# Patient Record
Sex: Female | Born: 1980
Health system: Southern US, Community
[De-identification: ages and names within clinical notes are randomized; demographics above are authoritative.]

## PROBLEM LIST (undated history)

## (undated) DIAGNOSIS — D649 Anemia, unspecified: Secondary | ICD-10-CM

## (undated) DIAGNOSIS — F32A Depression, unspecified: Secondary | ICD-10-CM

## (undated) DIAGNOSIS — F419 Anxiety disorder, unspecified: Secondary | ICD-10-CM

## (undated) HISTORY — DX: Depression, unspecified: F32.A

## (undated) HISTORY — DX: Anxiety disorder, unspecified: F41.9

---

## 2010-07-09 DIAGNOSIS — N059 Unspecified nephritic syndrome with unspecified morphologic changes: Secondary | ICD-10-CM

## 2010-07-09 HISTORY — DX: Unspecified nephritic syndrome with unspecified morphologic changes: N05.9

## 2011-07-10 DIAGNOSIS — O24419 Gestational diabetes mellitus in pregnancy, unspecified control: Secondary | ICD-10-CM

## 2011-07-10 HISTORY — DX: Gestational diabetes mellitus in pregnancy, unspecified control: O24.419

## 2019-08-26 ENCOUNTER — Ambulatory Visit (INDEPENDENT_AMBULATORY_CARE_PROVIDER_SITE_OTHER): Payer: BLUE CROSS/BLUE SHIELD | Admitting: Family Medicine

## 2019-09-28 ENCOUNTER — Encounter (INDEPENDENT_AMBULATORY_CARE_PROVIDER_SITE_OTHER): Payer: Self-pay | Admitting: Family Medicine

## 2019-09-28 ENCOUNTER — Ambulatory Visit (INDEPENDENT_AMBULATORY_CARE_PROVIDER_SITE_OTHER): Payer: BLUE CROSS/BLUE SHIELD | Admitting: Family Medicine

## 2019-09-28 VITALS — BP 118/80 | HR 80 | Temp 97.8°F | Resp 16 | Ht 67.75 in | Wt 137.0 lb

## 2019-09-28 DIAGNOSIS — E611 Iron deficiency: Secondary | ICD-10-CM | POA: Insufficient documentation

## 2019-09-28 DIAGNOSIS — E782 Mixed hyperlipidemia: Secondary | ICD-10-CM

## 2019-09-28 DIAGNOSIS — E559 Vitamin D deficiency, unspecified: Secondary | ICD-10-CM | POA: Insufficient documentation

## 2019-09-28 DIAGNOSIS — Z23 Encounter for immunization: Secondary | ICD-10-CM

## 2019-09-28 DIAGNOSIS — N078 Hereditary nephropathy, not elsewhere classified with other morphologic lesions: Secondary | ICD-10-CM

## 2019-09-28 DIAGNOSIS — Z01419 Encounter for gynecological examination (general) (routine) without abnormal findings: Secondary | ICD-10-CM

## 2019-09-28 NOTE — Patient Instructions (Addendum)
Sunday,    It was nice to meet you today! I am excited that you were able to get your Covid vaccine today.    1. Return to our walk-in clinic in 2 weeks to get your Tdap (tetanus shot).  2. You were given orders to have labs drawn. I will let you know when the results return.    Follow up in 6 months to check in on your mood and the zoloft. Otherwise, your next physical will be due in 1 year.    Take care,  Dr. Alwyn Pea

## 2019-09-28 NOTE — Progress Notes (Signed)
Date of Exam: 09/28/2019 4:52 PM        Patient ID: Gabrielle Dean is a 39 y.o. female.  Physician: Vanessa Ralphs, MD        Chief Complaint:    Chief Complaint   Patient presents with    Establish Care     New patient    Annual Exam             HPI:    Visit Type: Health Maintenance Visit  New patient, previously [facility] to [place] 2.5 years ago    Has hereditary nephritis  Mom and grandmother see a nephrologist in [place], has agreed to see her virtually  Has been on cardizem in past, not currently    Patient lives with husband, 2 kids 9yo, 7yo    CARDIAC SCREENING:  Diet: well-balanced, rare fast food, no soda; son recently dx with Crohn so has been eating more healthy recently  Exercise: walks dog daily  Sleep: no concerns  Tobacco/drug use: never  Alcohol: rare  Family hx of cardiac disease or stroke: yes    CANCER SCREENING:  ROS: Denies HA, fevers, unintentional weight loss  -PAP smear: 01/2018 normal, sees OBGYN    INFECTIOUS DISEASE:  -Tetanus shot: unsure, probably due  -Flu shot: 04/2019  -COVID vaccine: receiving today [facility] and [place]  -HIV one time screening: declines  -Hepatitis C Screening: declines    GENERAL HEALTH:  -Mood: on Zoloft 50mg  for depression, also a drop of 30mg  CBD oil at night  -Periods: regular  -Sexual activity/contraception/STD screening: Mirena IUD placed 01/2018            Problem List:    Patient Active Problem List   Diagnosis    Iron deficiency    Vitamin D deficiency    Mixed hyperlipidemia    Hereditary nephritis             Current Meds:    Outpatient Medications Marked as Taking for the 09/28/19 encounter (Office Visit) with Vanessa Ralphs, MD   Medication Sig Dispense Refill    b complex vitamins tablet Take 1 tablet by mouth daily      levonorgestrel (MIRENA) 20 MCG/24HR IUD 1 each by Intrauterine route once      Multiple Vitamins-Iron (Multivitamin Plus Iron Adult) Tab Take 1 tablet by mouth daily      sertraline  (ZOLOFT) 100 MG tablet Take 100 mg by mouth daily Take 1/2 tablet once daily      vitamin D (CHOLECALCIFEROL) 25 MCG (1000 UT) tablet Take 1,000 Units by mouth daily            Allergies:    Allergies   Allergen Reactions    Codeine Nausea And Vomiting           Past Surgical History:    Past Surgical History:   Procedure Laterality Date    uterer Right 1990    repair    VAGINAL DELIVERY  11/30/2009    1 M, 8lb 12 oz, 38weeks 6days.    VAGINAL DELIVERY  07/15/2012    77F, 8lb 14oz, 38weeks 5 days.           Family History:    Family History   Problem Relation Age of Onset    Arthritis Mother     Anxiety disorder Mother     Depression Mother     Hypertension Mother     Hyperlipidemia Mother     Nephritis Mother  Kidney cancer Father     Hypertension Father     Hyperlipidemia Father     Arthritis Maternal Grandmother     Hypertension Maternal Grandmother     Nephritis Maternal Grandmother     Arthritis Maternal Grandfather     Hypertension Maternal Grandfather     Diabetes type II Paternal Grandmother     Myocardial Infarction Paternal Grandmother         X2    Crohn's disease Son            Social History:    Social History     Tobacco Use    Smoking status: Never Smoker    Smokeless tobacco: Never Used   Substance Use Topics    Alcohol use: Not on file     Comment: Rarely    Drug use: Yes     Comment: CBD oil, 1 drop nightly          The following sections were reviewed this encounter by the provider: Meds     Problems   Med Hx   Surg Hx   Fam Hx              Vital Signs:    BP 118/80    Pulse 80    Temp 97.8 F (36.6 C)    Resp 16    Ht 1.721 m (5' 7.75")    Wt 62.1 kg (137 lb)    LMP 09/19/2019    BMI 20.98 kg/m          ROS:    Gen: Denies fevers, chills, unintentional weight loss  HEENT: Denies vision changes, hearing problems, congestion, sore throat  Cardiovascular: Denies CP, LE swelling, palpitations   Respiratory: Denies SOB, cough  Gastrointestinal: Denies abd pain, nausea,  vomiting, diarrhea, constipation, blood in the stool  Genitourinary: Denies dysuria or hematuria  Musculoskeletal: Denies joint pains or weakness  Skin: Denies rashes  Neurologic: Denies headaches or dizziness          Physical Exam:    Gen: well-appearing, awake and alert, no acute distress  Head: NC/AT  Ears: normal external ears, normal hearing, TMs and canals normal bilaterally  Eyes: PERRLA, EOMI, no conjunctival injection, no scleral icterus  Neck: supple, no cervical LAD, no thyromegaly  Heart: RRR, no murmurs, rubs, or gallops  Lungs: normal work of breathing, speaking in full sentences without difficulty, CTAB, no wheezing, rhonchi, or rales  Abd: soft, non-distended, non-tender to palpation, no organomegaly, BS+  Extremities: moves all extremities without difficulty, no LE swelling, warm  Skin: no rashes  Psych: normal mood and affect, clear speech           Assessment    1. Well woman exam  - Basic Metabolic Panel  - Lipid panel  - Hemoglobin A1C    2. Immunization due  - COVID-19 AD26 vaccine 0.5 ML (JANSSEN)    3. Mixed hyperlipidemia  - Basic Metabolic Panel  - Lipid panel    4. Hereditary nephritis  - Basic Metabolic Panel          Plan:    Patient Instructions   Gabrielle Dean,    It was nice to meet you today! I am excited that you were able to get your Covid vaccine today.    1. Return to our walk-in clinic in 2 weeks to get your Tdap (tetanus shot).  2. You were given orders to have labs drawn. I will let you know when the results return.  Follow up in 6 months to check in on your mood and the zoloft. Otherwise, your next physical will be due in 1 year.    Take care,  Dr. Alwyn Pea          Follow-up:    Return in about 1 year (around 09/27/2020) for annual physical.         Vanessa Ralphs, MD

## 2019-09-29 ENCOUNTER — Encounter (INDEPENDENT_AMBULATORY_CARE_PROVIDER_SITE_OTHER): Payer: Self-pay | Admitting: Family Medicine

## 2019-09-29 LAB — LIPID PANEL
Cholesterol / HDL Ratio: 6.8 ratio — ABNORMAL HIGH (ref 0.0–4.4)
Cholesterol: 287 mg/dL — ABNORMAL HIGH (ref 100–199)
HDL: 42 mg/dL (ref >39)
LDL Chol Calculated (NIH): 151 mg/dL — ABNORMAL HIGH (ref 0–99)
Triglycerides: 491 mg/dL — ABNORMAL HIGH (ref 0–149)
VLDL Calculated: 94 mg/dL — ABNORMAL HIGH (ref 5–40)

## 2019-09-29 LAB — BASIC METABOLIC PANEL
African American eGFR: 65 mL/min/{1.73_m2} (ref >59)
BUN / Creatinine Ratio: 22 (ref 9–23)
BUN: 27 mg/dL — ABNORMAL HIGH (ref 6–20)
CO2: 25 mmol/L (ref 20–29)
Calcium: 9.6 mg/dL (ref 8.7–10.2)
Chloride: 101 mmol/L (ref 96–106)
Creatinine: 1.24 mg/dL — ABNORMAL HIGH (ref 0.57–1.00)
Glucose: 90 mg/dL (ref 65–99)
Potassium: 5 mmol/L (ref 3.5–5.2)
Sodium: 140 mmol/L (ref 134–144)
non-African American eGFR: 56 mL/min/{1.73_m2} — ABNORMAL LOW (ref >59)

## 2019-09-29 LAB — HEMOGLOBIN A1C: Hemoglobin A1C: 5.3 % (ref 4.8–5.6)

## 2019-09-29 NOTE — Progress Notes (Signed)
Patient seen and discussed with Emma E Damon, MD   concurrently with the patient's visit/immediately following the patient's visit. Chart reviewed to include allergies, problems, current medications and family, social and past medical and surgical histories.      I reviewed the history, review of systems and physical exam as outlined in Emma E Damon, MD note and I agree with the findings as outlined in their note.      We discussed the assessment and plan for the encounter in detail and I concur with the assessment and plan as per the resident.

## 2019-10-08 ENCOUNTER — Encounter (INDEPENDENT_AMBULATORY_CARE_PROVIDER_SITE_OTHER): Payer: Self-pay | Admitting: Family Medicine

## 2020-02-05 ENCOUNTER — Other Ambulatory Visit: Payer: Self-pay

## 2020-02-05 ENCOUNTER — Emergency Department
Admission: EM | Admit: 2020-02-05 | Discharge: 2020-02-05 | Disposition: A | Discharge status: Home / Self Care | Payer: BLUE CROSS/BLUE SHIELD | Attending: Student in an Organized Health Care Education/Training Program | Admitting: Student in an Organized Health Care Education/Training Program

## 2020-02-05 ENCOUNTER — Emergency Department: Payer: BLUE CROSS/BLUE SHIELD

## 2020-02-05 DIAGNOSIS — D219 Benign neoplasm of connective and other soft tissue, unspecified: Secondary | ICD-10-CM

## 2020-02-05 DIAGNOSIS — N938 Other specified abnormal uterine and vaginal bleeding: Secondary | ICD-10-CM

## 2020-02-05 DIAGNOSIS — D251 Intramural leiomyoma of uterus: Secondary | ICD-10-CM | POA: Insufficient documentation

## 2020-02-05 LAB — URINALYSIS REFLEX TO MICROSCOPIC EXAM - REFLEX TO CULTURE
Bilirubin, UA: NEGATIVE
Glucose, UA: NEGATIVE
Ketones UA: NEGATIVE
Leukocyte Esterase, UA: NEGATIVE
Nitrite, UA: NEGATIVE
Protein, UR: >=500 — AB
Specific Gravity UA: 1.009 (ref 1.001–1.035)
Urine pH: 6 (ref 5.0–8.0)
Urobilinogen, UA: NORMAL mg/dL (ref 0.2–2.0)

## 2020-02-05 LAB — CBC AND DIFFERENTIAL
Absolute NRBC: 0 10*3/uL (ref 0.00–0.00)
Basophils Absolute Automated: 0.06 10*3/uL (ref 0.00–0.08)
Basophils Automated: 0.6 %
Eosinophils Absolute Automated: 0.22 10*3/uL (ref 0.00–0.44)
Eosinophils Automated: 2.2 %
Hematocrit: 36.9 % (ref 34.7–43.7)
Hgb: 12.4 g/dL (ref 11.4–14.8)
Immature Granulocytes Absolute: 0.04 10*3/uL (ref 0.00–0.07)
Immature Granulocytes: 0.4 %
Lymphocytes Absolute Automated: 2.34 10*3/uL (ref 0.42–3.22)
Lymphocytes Automated: 23.6 %
MCH: 30.5 pg (ref 25.1–33.5)
MCHC: 33.6 g/dL (ref 31.5–35.8)
MCV: 90.9 fL (ref 78.0–96.0)
MPV: 10.5 fL (ref 8.9–12.5)
Monocytes Absolute Automated: 0.72 10*3/uL (ref 0.21–0.85)
Monocytes: 7.3 %
Neutrophils Absolute: 6.52 10*3/uL — ABNORMAL HIGH (ref 1.10–6.33)
Neutrophils: 65.9 %
Nucleated RBC: 0 /100 WBC (ref 0.0–0.0)
Platelets: 267 10*3/uL (ref 142–346)
RBC: 4.06 10*6/uL (ref 3.90–5.10)
RDW: 12 % (ref 11–15)
WBC: 9.9 10*3/uL — ABNORMAL HIGH (ref 3.10–9.50)

## 2020-02-05 LAB — BASIC METABOLIC PANEL
Anion Gap: 8 (ref 5.0–15.0)
BUN: 24 mg/dL — ABNORMAL HIGH (ref 7–19)
CO2: 25 mEq/L (ref 22–29)
Calcium: 9.2 mg/dL (ref 8.5–10.5)
Chloride: 106 mEq/L (ref 100–111)
Creatinine: 1.4 mg/dL — ABNORMAL HIGH (ref 0.6–1.0)
Glucose: 103 mg/dL — ABNORMAL HIGH (ref 70–100)
Potassium: 4.4 mEq/L (ref 3.5–5.1)
Sodium: 139 mEq/L (ref 136–145)

## 2020-02-05 LAB — HCG, SERUM, QUALITATIVE: Hcg Qualitative: NEGATIVE

## 2020-02-05 LAB — GFR: EGFR: 42.4

## 2020-02-05 NOTE — ED Notes (Signed)
Pt provided with [place] cd copy

## 2020-02-05 NOTE — Discharge Instructions (Signed)
Dear Gabrielle Dean:    You were evaluated in the Emergency Department for vaginal bleeding.  Your labs show no acute abnormalities.  Your ultrasound shows you have fibroids.    The examination and treatment you have received today has been on an emergency basis only and has not been intended to substitute for complete medical care.  Since it is impossible to recognize and treat all elements of your injury or illness in a single visit, for your protection in preventing possible complications, if you are not clearly improving in 1-2 days, you should be seen for a re-check at the ER or with your primary care physician.        Instructions:    Continue home medications as prescribed.    Return to the ER for fevers, severe pain, intractable vomiting, chest pain, shortness of breath, worsening of conditions or any concerns.    Follow up with your ob/gyn in 1-2 days    Thank you for choosing [facility] for your medical care.    Sincerely,  Rosamaria Lints, MD  [facility] of Emergency Medicine    ________________________________________________________________    Thank you for choosing [facility] for your emergency care needs.  We strive to provide EXCELLENT care to you and your family.      IF YOU DO NOT CONTINUE TO IMPROVE OR YOUR CONDITION WORSENS, PLEASE CONTACT YOUR DOCTOR OR RETURN IMMEDIATELY TO THE EMERGENCY DEPARTMENT.    ONSITE PHARMACY  Our full service onsite pharmacy is a 2 minute walk from the ER.  Open Mon to Fri from 8 am to 8 pm, Sat  9 am to 5 pm. Ask an ED staff member for directions.  We accept all major insurances and prices are competitive with major retailers.  Ask your provider to print your prescriptions down to the pharmacy to speed you on your way home.    OBTAINING A PRIMARY CARE APPOINTMENT    Primary care physicians (PCPs, also known as primary care doctors) are either internists or family medicine doctors. Both types of PCPs focus on health promotion,  disease prevention, patient education and counseling, and treatment of acute and chronic medical conditions.    Call for an appointment with a primary care doctor.  Ask to see who is taking new patients. Two options are below.    Both groups have offices near [place] and in the [place] area.    [facility]  telephone:  [phone]  [email or web address]    [facility]  telephone:  [phone]  [email or web address]      DOCTOR REFERRALS  Call [phone] (available 24 hours a day, 7 days a week) if you need any further referrals and we can help you find a primary care doctor or specialist.  Also, available online at:  [email or web address]      YOUR CONTACT INFORMATION  Before leaving please check with registration to make sure we have an up-to-date contact number.  You can call registration at [phone] to update your information.  For questions about your hospital bill, please call [phone].  For questions about your Emergency Dept Physician bill please call [phone].      FREE HEALTH SERVICES  If you need help with health or social services, please call 2-1-1 for a free referral to resources in your area.  2-1-1 is a free service connecting people with information on health insurance, free clinics, pregnancy, mental health, dental care, food  assistance, housing, and substance abuse counseling.  Also, available online at:  http://www.[facility].org    MEDICAL RECORDS AND TESTS  Certain laboratory test results do not come back the same day, for example urine cultures.   We will contact you if other important findings are noted.  Radiology films are often reviewed again to ensure accuracy.  If there is any discrepancy, we will notify you.      Please call [phone] to pick up a complimentary CD of any radiology studies performed.  If you or your doctor would like to request a copy of your medical records, please call [phone].       ORTHOPEDIC INJURY   Please know that significant injuries can exist even when an initial x-ray is read as normal or negative.  This can occur because some fractures (broken bones) are not initially visible on x-rays.  For this reason, close outpatient follow-up with your primary care doctor or bone specialist (orthopedist) is required.    MEDICATIONS AND FOLLOWUP  Please be aware that some prescription medications can cause drowsiness.  Use caution when driving or operating machinery.    The examination and treatment you have received in our Emergency Department is provided on an emergency basis, and is not intended to be a substitute for your primary care physician.  It is important that your doctor checks you again and that you report any new or remaining problems at that time.      24 HOUR PHARMACIES  [facility] [address] [place], [place] [address] (1.4 miles, 7 minutes)  [facility] - [address], [place], [place] [address] (6.5 miles, 13 minutes)  Handout with directions available on request        ASSISTANCE WITH INSURANCE    Affordable Care Act  [facility])  Call to start or finish an application, compare plans, enroll or ask a question.  [phone]  TTY: [phone]  Web:  Healthcare.gov    Help Enrolling in [facility]  [employer] [place]  [phone] (TOLL-FREE)  [phone] (TTY)  Web:  Http://www.coverva.org    Local Help Enrolling in the [facility]  [place] [facility]  [phone] (MAIN)  Email:  health-help@nvfs .org  Web:  BlackjackMyths.is  Address:  [address] [place], [place] [address]        Dysfunctional Uterine Bleeding    You have been diagnosed with Dysfunctional Uterine Bleeding or D.U.B.    This means you have abnormal vaginal bleeding and are NOT pregnant.    You may have other symptoms. These could be pelvic/abdominal (belly) pain or cramps. You could also have back pain, nausea (feeling sick to your stomach) or problems urinating (peeing). You may  also have fatigue (feel tired) or have emotional disturbances. You may get these symptoms during your usual menstrual period. You may also get them at times in between. You may bleed the same amount as with a normal menstrual period. However, bleeding is usually heavier than normal.    Dysfunctional uterine bleeding is common. Many women get it and get evaluated.    Some causes are:   A change in hormone levels. This is the most common cause. It usually gets better without treatment. Sometimes recent changes in birth control pill use cause a temporary hormone imbalance. This will also get better without treatment.   Cervix or uterus infections or injury, endometriosis, uterine fibroids or tumors.   Sometimes the cause is more serious. However, this is rare. More serious causes include  cancer of the cervix or uterus.    The doctor caring for you decided it is OK for you to go home.    It is important to have a follow-up. See your gynecologist or family doctor in the next few days. If the medical staff did not tell your doctor about this bleeding evaluation visit, be sure to do so.   If you can't get the right follow-up care, tell the medical staff before you go. They can help make arrangements for you.    YOU SHOULD SEEK MEDICAL ATTENTION IMMEDIATELY, EITHER HERE OR AT THE NEAREST EMERGENCY DEPARTMENT, IF ANY OF THE FOLLOWING OCCURS:   Bad pain in the abdomen (belly), pelvis or back that gets worse.   Large amounts of vaginal bleeding that gets worse, soaking of pads/tampons (more than one pad per hour), passage of large clots.   Fever (temperature higher than 100.33F / 38C), chills, nausea, vomiting.   Dizziness, lightheadedness or fainting.

## 2020-02-05 NOTE — ED Notes (Signed)
DSW or Walking Ability Test    DSW Is the patient able to perform the following function independently without weakness or dizziness?   D (dangle): Ask patient to sit on the edge of the bed Yes   S (stand): Ask patient to stand up and observe for any fatigue or swaying Yes   W (walk): Ask patient to lift their feet completely off the floor 3-4 times Yes

## 2020-02-05 NOTE — ED Provider Notes (Signed)
[facility]    EMERGENCY DEPARTMENT HISTORY AND PHYSICAL EXAM    Patient Name: Gabrielle Dean, Gabrielle Dean  Encounter Date:  02/05/2020  Patient DOB:  Apr 08, 1981  MRN:  [id number]  Room:  21/B21    History of Presenting Illness     39 y.o. female ho anxiety, depression, nephritis co persistent, progressively worsening vaginal bleeding and lower abdominal cramping x 3 days.  Pt reports having vaginal spotting x 9-10 days and then increase in bleeding.  Pt states soaking through a heavy tampon and pad in 2 hours this evening.  Pt has been passing large clots.  Pt denies chest pain, SOB, nausea, vomiting, fevers, dizziness.  Denies taking blood thinners.  Pt has had an IUD in place for 1 year and 9 month.    PMD:  Dr. Alwyn Pea    Past Medical History     Past Medical History:   Diagnosis Date    Anxiety     Depression     Gestational diabetes 2013    with second pregnancy    Nephritis 2012    heriditary        Past Surgical History     Past Surgical History:   Procedure Laterality Date    uterer Right 1990    repair    VAGINAL DELIVERY  11/30/2009    1 M, 8lb 12 oz, 38weeks 6days.    VAGINAL DELIVERY  07/15/2012    11F, 8lb 14oz, 38weeks 5 days.       Family History     Family History   Problem Relation Age of Onset    Arthritis Mother     Anxiety disorder Mother     Depression Mother     Hypertension Mother     Hyperlipidemia Mother     Nephritis Mother     Kidney cancer Father     Hypertension Father     Hyperlipidemia Father     Arthritis Maternal Grandmother     Hypertension Maternal Grandmother     Nephritis Maternal Grandmother     Arthritis Maternal Grandfather     Hypertension Maternal Grandfather     Diabetes type II Paternal Grandmother     Myocardial Infarction Paternal Grandmother         X2    Crohn's disease Son        Social History     Social History     Occupational History    Not on file   Tobacco Use    Smoking status: Never Smoker    Smokeless tobacco: Never Used    Haematologist Use: Never used   Substance and Sexual Activity    Alcohol use: Not on file     Comment: Rarely    Drug use: Yes     Comment: CBD oil, 1 drop nightly    Sexual activity: Yes     Partners: Male        Review of Systems     Review of Systems   Constitutional: Negative for fever and chills.   HENT: Negative for sore throat.    Respiratory: Negative for cough and shortness of breath.    Cardiovascular: Negative for chest pain.   Gastrointestinal: Negative for nausea, vomiting, abdominal pain and diarrhea.   Genitourinary: Negative for dysuria.   Neurological: Negative for headaches.  Skin: Negative for rash   All other systems reviewed and are negative.    Physical Exam   BP 122/84  Pulse 80    Temp 98.2 F (36.8 C) (Oral)    Resp 15    SpO2 96%     Physical Exam   Constitutional: Patient is oriented to person, place, and time. Patient appears well-developed and well-nourished. No distress.   HENT:   Head: Normocephalic and atraumatic.   Right Ear: External ear normal.   Left Ear: External ear normal.   Nose: Nose normal.   Mouth/Throat: Oropharynx is clear and moist.   Eyes: Conjunctivae and EOM are normal.   Neck: Neck supple. No tracheal deviation present.   Cardiovascular: Normal rate, regular rhythm and normal heart sounds.  Exam reveals no gallop and no friction rub.  No murmur heard.  Pulmonary/Chest: No respiratory distress. Patient has no wheezes. Patient has no rales.   Abdominal: Soft. Patient exhibits no distension. There is no tenderness. There is no rebound and no guarding.   Pelvic: deferred  Musculoskeletal: Normal range of motion. Patient exhibits no edema.   Neurological: Patient is alert and oriented to person, place, and time. No gross cranial nerve deficit.   Skin: Skin is warm. No rash noted.   Psychiatric: Patient has a normal mood and affect. Patient's behavior is normal.   Nursing note and vitals reviewed.    ED Medications Administered     ED Medication Orders  (From admission, onward)    None          Orders Placed During This Encounter     Orders Placed This Encounter   Procedures    Urine culture    US Pelvic with Transvaginal (r/o torsion)    CBC and differential    Basic Metabolic Panel    GFR    Beta HCG, Qual, Serum    UA Reflex to Micro - Reflex to Culture       Diagnostic Study Results     The results of the diagnostic studies below were reviewed by the ED provider:    Labs  Results     Procedure Component Value Units Date/Time    UA Reflex to Micro - Reflex to Culture [[id number]]  (Abnormal) Collected: 02/05/20 0259     Updated: 02/05/20 0321     Urine Type Urine, Clean Ca     Color, UA Red     Clarity, UA Hazy     Specific Gravity UA 1.009     Urine pH 6.0     Leukocyte Esterase, UA Negative     Nitrite, UA Negative     Protein, UR >=500     Glucose, UA Negative     Ketones UA Negative     Urobilinogen, UA Normal mg/dL      Bilirubin, UA Negative     Blood, UA Large     RBC, UA TNTC /hpf      WBC, UA TNTC /hpf     Basic Metabolic Panel [[id number]]  (Abnormal) Collected: 02/05/20 0224    Specimen: Blood Updated: 02/05/20 0248     Glucose 103 mg/dL      BUN 24 mg/dL      Creatinine 1.4 mg/dL      Calcium 9.2 mg/dL      Sodium 657 mEq/L      Potassium 4.4 mEq/L      Chloride 106 mEq/L      CO2 25 mEq/L      Anion Gap 8.0    GFR [[id number]] Collected: 02/05/20 0224     Updated: 02/05/20 8413  EGFR 42.4    Beta HCG, Qual, Serum [[id number]] Collected: 02/05/20 0224    Specimen: Blood Updated: 02/05/20 0240     Hcg Qualitative Negative    CBC and differential [[id number]]  (Abnormal) Collected: 02/05/20 0224    Specimen: Blood Updated: 02/05/20 0232     WBC 9.90 x10 3/uL      Hgb 12.4 g/dL      Hematocrit 78.2 %      Platelets 267 x10 3/uL      RBC 4.06 x10 6/uL      MCV 90.9 fL      MCH 30.5 pg      MCHC 33.6 g/dL      RDW 12 %      MPV 10.5 fL      Neutrophils 65.9 %      Lymphocytes Automated 23.6 %      Monocytes 7.3 %      Eosinophils Automated 2.2 %       Basophils Automated 0.6 %      Immature Granulocytes 0.4 %      Nucleated RBC 0.0 /100 WBC      Neutrophils Absolute 6.52 x10 3/uL      Lymphocytes Absolute Automated 2.34 x10 3/uL      Monocytes Absolute Automated 0.72 x10 3/uL      Eosinophils Absolute Automated 0.22 x10 3/uL      Basophils Absolute Automated 0.06 x10 3/uL      Immature Granulocytes Absolute 0.04 x10 3/uL      Absolute NRBC 0.00 x10 3/uL           Radiologic Studies  Radiology Results (24 Hour)     Procedure Component Value Units Date/Time    US Pelvic with Transvaginal (r/o torsion) [[id number]] Collected: 02/05/20 0553    Order Status: Completed Updated: 02/05/20 0558    Narrative:      HISTORY: Evaluate vaginal bleeding    COMPARISON: None    TECHNIQUE: Transabdominal ultrasound of the pelvis. Transvaginal imaging  is performed to better characterize the uterus, ovaries and adnexa    FINDINGS:     UTERUS:  The uterus is anteverted, measuring 10.9 x 5.3 x 5.6 cm. There  is an intramural fibroid in the uterine fundus measuring 2.6 x 2.6 x 2.5  cm. An additional intraneural fibroid is in the posterior uterine body  measuring 1.5 x 1.5 x 1.5 cm. The endometrial stripe measures 6 mm and  is distended by a trace amount of anechoic fluid.    RIGHT OVARY: The right ovary measures 3.1 x 1.9 x 2.5 cm, and is  sonographically unremarkable.    LEFT OVARY:  The left ovary measures 2.8 x 2.1 x 2.1 cm, and is  sonographically unremarkable.    ADNEXA: No adnexal masses.    FREE FLUID: Small amount of simple appearing free fluid in the pelvis.      Impression:          No acute abnormality.    Fibroid uterus as described.    No evidence of ovarian torsion.    Theador Hawthorne   02/05/2020 5:56 AM            Monitors, EKG, Critical Care, Vitals and Splints   Cardiac Monitor (interpreted by ED physician):    EKG (interpreted by ED physician):   Critical Care:   Splint check:      MDM and Clinical Notes     MDM:  Eval for fibroid vs miscarriage vs ectopic  vs DUB.  Eval for  electrolyte abnl/anemia. Labs, [place]    5:00AM  Pt sitting comfortably in bed.    6:25AM  Extensive discussion with pt regarding results. Discussed pelvic exam, pt declines pelvic exam and will follow up with ob/gyn.  Offered to call ob/gyn.  Pt will call this morning.  Comfortable with d/c to home.  Return precautions discussed.  Will follow up with ob/gyn.      Diagnosis and Disposition     Clinical Impression  1. Dysfunctional uterine bleeding    2. Fibroid          Disposition  ED Disposition     ED Disposition Condition Date/Time Comment    Discharge  Fri Feb 05, 2020  6:25 AM Erin Sons discharge to home/self care.    Condition at disposition: Stable            Prescriptions       Discharge Prescriptions     None                 Rosamaria Lints, MD  02/05/20 [phone]

## 2020-02-13 ENCOUNTER — Other Ambulatory Visit: Payer: Self-pay | Admitting: Obstetrics & Gynecology

## 2020-05-09 ENCOUNTER — Ambulatory Visit (INDEPENDENT_AMBULATORY_CARE_PROVIDER_SITE_OTHER): Payer: BLUE CROSS/BLUE SHIELD | Admitting: Physician Assistant

## 2020-05-09 ENCOUNTER — Encounter (INDEPENDENT_AMBULATORY_CARE_PROVIDER_SITE_OTHER): Payer: Self-pay | Admitting: Physician Assistant

## 2020-05-09 VITALS — BP 122/72 | HR 76 | Temp 98.8°F | Resp 16 | Ht 68.0 in | Wt 135.0 lb

## 2020-05-09 DIAGNOSIS — G8929 Other chronic pain: Secondary | ICD-10-CM

## 2020-05-09 DIAGNOSIS — N078 Hereditary nephropathy, not elsewhere classified with other morphologic lesions: Secondary | ICD-10-CM

## 2020-05-09 DIAGNOSIS — F411 Generalized anxiety disorder: Secondary | ICD-10-CM

## 2020-05-09 DIAGNOSIS — R6884 Jaw pain: Secondary | ICD-10-CM

## 2020-05-09 DIAGNOSIS — Z01818 Encounter for other preprocedural examination: Secondary | ICD-10-CM

## 2020-05-09 MED ORDER — CYCLOBENZAPRINE HCL 10 MG PO TABS
5.0000 mg | ORAL_TABLET | Freq: Three times a day (TID) | ORAL | 0 refills | Status: AC | PRN
Start: 2020-05-09 — End: 2020-06-08

## 2020-05-09 NOTE — Progress Notes (Signed)
Subjective:      Patient ID: Gabrielle Dean is a 39 y.o. female     Chief Complaint   Patient presents with    Possible TMJ     x friday afternoon      HPI   Pt here for left-sided jaw pain.  Has had popping in jaw and intermittent dull pain for 3-4 months; saw dentist within past two weeks, told no signs of grinding.   At the end of last week, started with moderately severe left jaw pain that is constant.  Pt with long-standing anxiety; notes she had decreased sertraline from 100 to 50 mg once daily due to decreased libido; had added CBD to help anxiety, which did work.  Then had to stop CBD per gyn for upcoming surgery, stopped 3 days before jaw pain started.  Wonders if pain related to tension, stress on lower dose of medication.  Has taken Tylenol, then later in the evening Advil 400 mg, helped slightly.  Has hereditary nephritis; nephro in [place], last visit in 6-01/2020; avoids/limits nsaids.  Gyn is Dr. Maryclare Dean; gyn prescribes sertraline.  Pt for hysterectomy with gyn in few weeks, will need pre-op clearance and labs.    The following sections were reviewed this encounter by the provider:   Tobacco   Allergies   Meds   Problems   Med Hx   Surg Hx   Fam Hx        Patient Active Problem List   Diagnosis    Iron deficiency    Vitamin D deficiency    Mixed hyperlipidemia    Hereditary nephritis    GAD (generalized anxiety disorder)     Outpatient Medications Marked as Taking for the 05/09/20 encounter (Office Visit) with Gabrielle Dean, Gabrielle Fast, PA   Medication Sig Dispense Refill    b complex vitamins tablet Take 1 tablet by mouth daily      iron-vitamin C (Vitron-C) 65-125 MG Tab Take 1 tablet by oral route daily      Multiple Vitamins-Iron (Multivitamin Plus Iron Adult) Tab Take 1 tablet by mouth daily      sertraline (ZOLOFT) 100 MG tablet Take 100 mg by mouth daily Take 1/2 tablet once daily        Sprintec 28 0.25-35 MG-MCG per tablet       vitamin D (CHOLECALCIFEROL) 25 MCG (1000 UT) tablet Take 1,000  Units by mouth daily       Immunization History   Administered Date(s) Administered    COVID-19 Ad26 Vaccine Preservative Free 0.5 mL (JANSSEN) 09/28/2019    Influenza (Im) Preservative Free 04/09/2019    Influenza quadrivalent (IM) PF 3 Yrs & greater 05/02/2020     Review of Systems   Constitutional: Negative for chills and fever.   HENT: Negative for ear pain, sinus pain and sore throat.    Gastrointestinal: Positive for vomiting (x 1 on 05/06/2020).   Genitourinary: Positive for vaginal bleeding (menses started 1 day ago).   Musculoskeletal: Positive for arthralgias.   Skin: Negative for rash and wound.        BP 122/72    Pulse 76    Temp 98.8 F (37.1 C)    Resp 16    Ht 1.727 m (5\' 8" )    Wt 61.2 kg (135 lb)    LMP 05/08/2020    BMI 20.53 kg/m     Objective:     Physical Exam  Constitutional:       General: She is  not in acute distress.     Appearance: Normal appearance.   HENT:      Head: Normocephalic and atraumatic.      Jaw: Tenderness (on left) and pain on movement (on left) present. No swelling.      Right Ear: Tympanic membrane, ear canal and external ear normal. There is no impacted cerumen.      Left Ear: Tympanic membrane, ear canal and external ear normal. There is no impacted cerumen.      Mouth/Throat:      Mouth: Mucous membranes are moist.      Pharynx: No oropharyngeal exudate or posterior oropharyngeal erythema.   Cardiovascular:      Rate and Rhythm: Normal rate and regular rhythm.      Heart sounds: Normal heart sounds.   Pulmonary:      Effort: Pulmonary effort is normal.      Breath sounds: Normal breath sounds.   Skin:     General: Skin is warm.      Findings: No rash.   Neurological:      General: No focal deficit present.      Mental Status: She is alert.      Cranial Nerves: No cranial nerve deficit.   Psychiatric:         Mood and Affect: Mood normal.          Assessment/Plan:     1. Chronic jaw pain  - Ambulatory referral to Physical Therapy  - cyclobenzaprine (FLEXERIL) 10 MG  tablet; Take 0.5 tablets (5 mg total) by mouth every 8 (eight) hours as needed for Muscle spasms  Dispense: 20 tablet; Refill: 0  - Refer for PT  - Warm compress to area; tylenol as needed; sparingly use nsaid  - Cyclobenzaprine as needed  - RTC 05/12/2020 for f/u & preop clearance    2. Hereditary nephritis  - Use nsaid sparingly  - Has nephro in [place], last labs few months ago    3. GAD (generalized anxiety disorder)  - Temporarily increase sertraline back to 100 mg; consider switch to Cymbalta (duloxetine) after surgery    4. Preoperative clearance  - Basic Metabolic Panel  - CBC and differential         Gabrielle Dean Gabrielle Nanna, PA-C

## 2020-05-10 LAB — CBC AND DIFFERENTIAL
Baso(Absolute): 0.1 10*3/uL (ref 0.0–0.2)
Basos: 0 %
Eos: 1 %
Eosinophils Absolute: 0.1 10*3/uL (ref 0.0–0.4)
Hematocrit: 39.9 % (ref 34.0–46.6)
Hemoglobin: 12.6 g/dL (ref 11.1–15.9)
Immature Granulocytes Absolute: 0 10*3/uL (ref 0.0–0.1)
Immature Granulocytes: 0 %
Lymphocytes Absolute: 1.6 10*3/uL (ref 0.7–3.1)
Lymphocytes: 14 %
MCH: 29.2 pg (ref 26.6–33.0)
MCHC: 31.6 g/dL (ref 31.5–35.7)
MCV: 93 fL (ref 79–97)
Monocytes Absolute: 0.5 10*3/uL (ref 0.1–0.9)
Monocytes: 5 %
Neutrophils Absolute: 9.2 10*3/uL — ABNORMAL HIGH (ref 1.4–7.0)
Neutrophils: 80 %
Platelets: 366 10*3/uL (ref 150–450)
RBC: 4.31 x10E6/uL (ref 3.77–5.28)
RDW: 11.7 % (ref 11.7–15.4)
WBC: 11.5 10*3/uL — ABNORMAL HIGH (ref 3.4–10.8)

## 2020-05-10 LAB — BASIC METABOLIC PANEL
African American eGFR: 48 mL/min/{1.73_m2} — ABNORMAL LOW (ref >59)
BUN / Creatinine Ratio: 13 (ref 9–23)
BUN: 21 mg/dL — ABNORMAL HIGH (ref 6–20)
CO2: 25 mmol/L (ref 20–29)
Calcium: 9.6 mg/dL (ref 8.7–10.2)
Chloride: 103 mmol/L (ref 96–106)
Creatinine: 1.57 mg/dL — ABNORMAL HIGH (ref 0.57–1.00)
Glucose: 85 mg/dL (ref 65–99)
Potassium: 5.4 mmol/L — ABNORMAL HIGH (ref 3.5–5.2)
Sodium: 141 mmol/L (ref 134–144)
non-African American eGFR: 42 mL/min/{1.73_m2} — ABNORMAL LOW (ref >59)

## 2020-05-12 ENCOUNTER — Encounter (INDEPENDENT_AMBULATORY_CARE_PROVIDER_SITE_OTHER): Payer: Self-pay | Admitting: Physician Assistant

## 2020-05-12 ENCOUNTER — Ambulatory Visit (INDEPENDENT_AMBULATORY_CARE_PROVIDER_SITE_OTHER): Payer: BLUE CROSS/BLUE SHIELD | Admitting: Physician Assistant

## 2020-05-12 VITALS — BP 122/80 | HR 72 | Temp 98.6°F | Resp 16 | Ht 68.0 in | Wt 138.0 lb

## 2020-05-12 DIAGNOSIS — D72828 Other elevated white blood cell count: Secondary | ICD-10-CM

## 2020-05-12 DIAGNOSIS — G8929 Other chronic pain: Secondary | ICD-10-CM

## 2020-05-12 DIAGNOSIS — R6884 Jaw pain: Secondary | ICD-10-CM

## 2020-05-12 DIAGNOSIS — Z01818 Encounter for other preprocedural examination: Secondary | ICD-10-CM

## 2020-05-12 DIAGNOSIS — N078 Hereditary nephropathy, not elsewhere classified with other morphologic lesions: Secondary | ICD-10-CM

## 2020-05-12 DIAGNOSIS — N939 Abnormal uterine and vaginal bleeding, unspecified: Secondary | ICD-10-CM

## 2020-05-12 NOTE — Progress Notes (Signed)
Subjective:      Date: 05/12/2020 6:27 PM   Patient ID: Gabrielle Dean is a 39 y.o. female.    Chief Complaint:  Chief Complaint   Patient presents with    Pre-op Exam    Follow-up     jaw pain, pre-op labs     HPI:  Visit Type: Pre-operative evaluation, as requested by surgeon  Diagnosis: abnormal uterine bleeding  Procedure: total laparoscopic hysterectomy, bilateral salpingectomy  Date of Surgery: 05/30/2020  Surgeon: Maryclare Labrador, MD  Fax Number (Required): [phone]  Recent Health (admits): jaw pain, as below; resolving  Recent Health (denies): no current complaints  Exercise Tolerance: no problems with exercise tolerance  Surgical Risk Factors: renal disease, pre-op labs as below  Prior Anesthesia: Patient reports no adverse reaction to anesthesia in the past.      Pt is s/p her pre-op BMP & CBC on 05/09/2020; BMP with Cr 1.57, eGFR 42, K+ 5.4 (pt with h/o hereditary nephritis, Cr 1.4 01/2020, 1.24, eGFR 56 09/2019).  CBC shows wbc to 11.5 (9.9 01/2020).    Additional reason for visit:  05/09/2020:  Pt here for left-sided jaw pain.  Has had popping in jaw and intermittent dull pain for 3-4 months; saw dentist within past two weeks, told no signs of grinding.   At the end of last week, started with moderately severe left jaw pain that is constant.  Pt with long-standing anxiety; notes she had decreased sertraline from 100 to 50 mg once daily due to decreased libido; had added CBD to help anxiety, which did work.  Then had to stop CBD per gyn for upcoming surgery, stopped 3 days before jaw pain started.  Wonders if pain related to tension, stress on lower dose of medication.  Has taken Tylenol, then later in the evening Advil 400 mg, helped slightly.  Has hereditary nephritis; nephro in [place], last visit in 6-01/2020; avoids/limits nsaids.  Gyn is Dr. Maryclare Labrador; gyn prescribes sertraline.  Pt for hysterectomy with gyn in few weeks, will need pre-op clearance and labs.    05/12/2020:  Pt here for f/u.  Feeling  much better from last visit.  Still has tension in her jaw, but much more manageable than previous.  Has taken few doses of ibuprofen, and cyclobenzaprine, and heating pad.  Yesterday, took nothing until cyclobenzaprine in the evening.  Now back on sertraline 100 mg, which seems to be helping.    Problem List:  Patient Active Problem List   Diagnosis    Iron deficiency    Vitamin D deficiency    Mixed hyperlipidemia    Hereditary nephritis    GAD (generalized anxiety disorder)       Current Medications:  Outpatient Medications Marked as Taking for the 05/12/20 encounter (Surgical Consult) with Rameen Gohlke, Windy Fast, PA   Medication Sig Dispense Refill    b complex vitamins tablet Take 1 tablet by mouth daily      cyclobenzaprine (FLEXERIL) 10 MG tablet Take 0.5 tablets (5 mg total) by mouth every 8 (eight) hours as needed for Muscle spasms 20 tablet 0    iron-vitamin C (Vitron-C) 65-125 MG Tab Take 1 tablet by mouth every evening         Multiple Vitamins-Iron (Multivitamin Plus Iron Adult) Tab Take 1 tablet by mouth daily      sertraline (ZOLOFT) 100 MG tablet Take 100 mg by mouth every evening         Sprintec 28 0.25-35 MG-MCG per tablet Take  1 tablet by mouth every evening         vitamin D (CHOLECALCIFEROL) 25 MCG (1000 UT) tablet Take 1,000 Units by mouth daily         Allergies:  Allergies   Allergen Reactions    Codeine Nausea And Vomiting       Past Medical History:  Past Medical History:   Diagnosis Date    Anemia     On Vitron C    Anxiety     Depression     Gestational diabetes 2013    with second pregnancy - resolved    Nephritis 2012    heriditary - monitored/manged by Nephrology in [facility] 6 month follow up       Past Surgical History:  Past Surgical History:   Procedure Laterality Date    uterer Right 1990    repair    VAGINAL DELIVERY  11/30/2009    1 M, 8lb 12 oz, 38weeks 6days.    VAGINAL DELIVERY  07/15/2012    49F, 8lb 14oz, 38weeks 5 days.       Family History:  Family History    Problem Relation Age of Onset    Arthritis Mother     Anxiety disorder Mother     Depression Mother     Hypertension Mother     Hyperlipidemia Mother     Nephritis Mother     Kidney cancer Father     Hypertension Father     Hyperlipidemia Father     Arthritis Maternal Grandmother     Hypertension Maternal Grandmother     Nephritis Maternal Grandmother     Arthritis Maternal Grandfather     Hypertension Maternal Grandfather     Diabetes type II Paternal Grandmother     Myocardial Infarction Paternal Grandmother         X2    Crohn's disease Son        Social History:  Social History     Tobacco Use    Smoking status: Never Smoker    Smokeless tobacco: Never Used   Haematologist Use: Never used   Substance Use Topics    Alcohol use: Not on file     Comment: Rarely    Drug use: Not Currently     Comment: CBD oil, 1 drop nightly - stopped 05/02/20 per surgeon per pt         The following sections were reviewed this encounter by the provider:   Tobacco   Allergies   Meds   Problems   Med Hx   Surg Hx   Fam Hx          Vitals:  BP 122/80    Pulse 72    Temp 98.6 F (37 C)    Resp 16    Ht 1.727 m (5\' 8" )    Wt 62.6 kg (138 lb)    LMP 05/08/2020    BMI 20.98 kg/m     ROS:  Review of Systems   General/Constitutional:           Present--feeling well.  Denies fever and denies chills.      Respiratory:           Denies cough, difficulty breathing, difficulty breathing on exertion, and wheezing.   Cardiovascular:           Denies chest pain, difficulty breathing lying down, difficulty breathing on exertion, fainting/blacking out, leg pain, leg swelling, and palpitations.  Gastrointestinal:           Denies abdominal pain, black tarry stool, and bloody stool.       Hematology:           Denies abnormal bleeding, easy bleeding, and easy bruising.       Peripheral Vascular:           Denies swelling of extremities and leg pain with exertion.       Skin:           Denies rash and new lesions.        Neurologic:           Denies dizziness and syncope.         Objective:     Examination:   Physical Exam   General Examination:  GENERAL APPEARANCE: alert, in no acute distress, well developed, well nourished, oriented to time, place, and person.   HEAD: normocephalic, atraumatic with no lesions or palpable masses.  ENMT: Normal oral mucosa, normal oropharynx.   NECK/THYROID: neck supple, no lymphadenopathy, no thyromegaly.   SKIN: normal mobility and turgor.  No rashes.   HEART: S1, S2 normal, no murmurs, rubs, gallops, regular rate and rhythm.   LUNGS: normal effort / no distress, normal breath sounds, clear to auscultation bilaterally, no wheezes, rales, or rhonchi.   ABDOMEN: inspection normal.  Abdomen soft, non-tender, no rigidity/guarding, no palpable abdominal masses.  Bowel sounds normal.  EXTREMITIES: no clubbing, cyanosis, or edema.   PERIPHERAL PULSES: 2+ dorsalis pedis bilaterally.   PSYCH: alert, oriented, cognitive function intact.      Assessment/Plan:     1. Abnormal uterine bleeding  - For total laparoscopic hysterectomy, bilateral salpingectomy on 05/30/2020 with Maryclare Labrador, MD  - Appears well, no problems noted.  Patient has low clinical risk factors and good exercise tolerance, and is to undergo an intermediate risk procedure.  Labs checked per surgeon's request--will repeat in one week, as below.  If normal, patient may proceed to the OR without further evaluation.    2. Preoperative clearance    3. Chronic jaw pain  - Essentially resolved; monitor  - Follow up 06/2020 to discuss switch from sertraline to duloxetine    4. Hereditary nephritis  - Basic Metabolic Panel    5. Other elevated white blood cell (WBC) count  - CBC and differential  BMP with Cr 1.57, eGFR 42, K+ 5.4 (pt with h/o hereditary nephritis, Cr 1.4 01/2020, 1.24, eGFR 56 09/2019).  CBC shows wbc to 11.5 (9.9 01/2020).    Will repeat CBC & BMP next week for recheck; encourage regular fluids, and no nsaids until then.           Windy Fast Kallie Depolo, PA-C

## 2020-05-18 ENCOUNTER — Telehealth: Payer: BLUE CROSS/BLUE SHIELD

## 2020-05-18 ENCOUNTER — Encounter (HOSPITAL_BASED_OUTPATIENT_CLINIC_OR_DEPARTMENT_OTHER): Payer: Self-pay | Admitting: Obstetrics & Gynecology

## 2020-05-18 NOTE — Pre-Procedure Instructions (Signed)
·   Chart navigated by nav   1401 Phone Interview completed with pt   ANESTHESIA GUIDELINES: none   SURGEON REQUIREMENT:     Noted from pt completed 05/12/2020 Preop eval at PCP per surgeons order in epic.   SPECIALIST NOTES/TEST RESULT REQUESTS:        See Nav 1 hand off   FUTURE PLAN/UPCOMING APPTS:     Pt scheduled on 05/20/20 to get Pre-op labs at PCP office per  Surgeons office see epic   Pt plans to go to [facility] on 05/20/20 to get T&S, will bring surgeons order with her per patient      RECENT HOSPITALIZATION/ED VISIT: n/a   NAV 1 HAND OFF for Please fax request  Nephrology LOV if nav team finds it's needed, Interview RN time did not allow to request   Pt stated no pre-op EKG was ordered by surgeon, in  PCP Pre-op note states "Will check labs, EKG per surgeon's request" please follow up with PCP or Surgeon, Interview RN time did not allow    EMAIL SENT to n/a   OTHER/MISC:    05/09/20 cbc,bmp wdl in epic    09/08/19 a1c wdl  In epic   Labs/Testing at [facility]: n/a

## 2020-05-20 ENCOUNTER — Ambulatory Visit: Payer: BLUE CROSS/BLUE SHIELD | Attending: Obstetrics & Gynecology

## 2020-05-20 ENCOUNTER — Other Ambulatory Visit: Payer: Self-pay | Admitting: Obstetrics & Gynecology

## 2020-05-20 DIAGNOSIS — N939 Abnormal uterine and vaginal bleeding, unspecified: Secondary | ICD-10-CM

## 2020-05-20 DIAGNOSIS — Z01818 Encounter for other preprocedural examination: Secondary | ICD-10-CM | POA: Insufficient documentation

## 2020-05-20 LAB — TYPE AND SCREEN
AB Screen Gel: NEGATIVE
ABO Rh: O POS

## 2020-05-21 LAB — CBC AND DIFFERENTIAL
Baso(Absolute): 0.1 10*3/uL (ref 0.0–0.2)
Basos: 0 %
Eos: 1 %
Eosinophils Absolute: 0.1 10*3/uL (ref 0.0–0.4)
Hematocrit: 29.5 % — ABNORMAL LOW (ref 34.0–46.6)
Hemoglobin: 9.7 g/dL — ABNORMAL LOW (ref 11.1–15.9)
Immature Granulocytes Absolute: 0 10*3/uL (ref 0.0–0.1)
Immature Granulocytes: 0 %
Lymphocytes Absolute: 2.1 10*3/uL (ref 0.7–3.1)
Lymphocytes: 14 %
MCH: 30.1 pg (ref 26.6–33.0)
MCHC: 32.9 g/dL (ref 31.5–35.7)
MCV: 92 fL (ref 79–97)
Monocytes Absolute: 0.6 10*3/uL (ref 0.1–0.9)
Monocytes: 4 %
Neutrophils Absolute: 11.9 10*3/uL — ABNORMAL HIGH (ref 1.4–7.0)
Neutrophils: 81 %
Platelets: 346 10*3/uL (ref 150–450)
RBC: 3.22 x10E6/uL — ABNORMAL LOW (ref 3.77–5.28)
RDW: 11.5 % — ABNORMAL LOW (ref 11.7–15.4)
WBC: 14.8 10*3/uL — ABNORMAL HIGH (ref 3.4–10.8)

## 2020-05-21 LAB — BASIC METABOLIC PANEL
African American eGFR: 45 mL/min/{1.73_m2} — ABNORMAL LOW (ref >59)
BUN / Creatinine Ratio: 14 (ref 9–23)
BUN: 23 mg/dL — ABNORMAL HIGH (ref 6–20)
CO2: 23 mmol/L (ref 20–29)
Calcium: 8.7 mg/dL (ref 8.7–10.2)
Chloride: 103 mmol/L (ref 96–106)
Creatinine: 1.67 mg/dL — ABNORMAL HIGH (ref 0.57–1.00)
Glucose: 168 mg/dL — ABNORMAL HIGH (ref 65–99)
Potassium: 4.4 mmol/L (ref 3.5–5.2)
Sodium: 142 mmol/L (ref 134–144)
non-African American eGFR: 39 mL/min/{1.73_m2} — ABNORMAL LOW (ref >59)

## 2020-05-23 ENCOUNTER — Encounter (INDEPENDENT_AMBULATORY_CARE_PROVIDER_SITE_OTHER): Payer: Self-pay | Admitting: Physician Assistant

## 2020-05-23 ENCOUNTER — Telehealth (INDEPENDENT_AMBULATORY_CARE_PROVIDER_SITE_OTHER): Payer: Self-pay | Admitting: Family Medicine

## 2020-05-23 NOTE — Telephone Encounter (Signed)
faxed

## 2020-05-23 NOTE — Telephone Encounter (Signed)
Please fax bw results to [facility] at [phone].    Thanks

## 2020-05-24 ENCOUNTER — Other Ambulatory Visit (INDEPENDENT_AMBULATORY_CARE_PROVIDER_SITE_OTHER): Payer: Self-pay | Admitting: Physician Assistant

## 2020-05-24 DIAGNOSIS — D72829 Elevated white blood cell count, unspecified: Secondary | ICD-10-CM

## 2020-05-29 NOTE — H&P (Signed)
Subjective:      Chief Complaint: AUB    History of Present Illness:  Gabrielle Dean is a 39 y.o. female, para 2002, who is scheduled for TLH, BS, cysto with ureteral stent placement secondary to AUB failed medical management.     She has a long history of heavy and irregular menses.   She underwent Hsc, D&C, polypectomy in July 2019 without improvement in her bleeding, path demonstrated dyssynchronous secretory endometrium.   She had a Mirena IUD (placed 03/17/18) for contraception. She reported monthly bleeding with the Mirena IUD since placement that lasted 10-14 days. Bleeding was light spotting to normal menstrual flow. With her menstrual cycle starting 01/19/20 she reported prolonged bleeding that lasted 22 days. This bleeding was significantly heavier than her usual bleeding. She reported that approximately every other day she would have heavy menstrual flow with palm-sized clots that requires use of a pad and tampon every 1-2 hours. She would have to get up 2-3 times nightly to change her pad and tampon. She also reported feeling dizzy and having low energy during menses.   She was seen in the ED on 7/30 due to very heavy bleeding with the following evaluation:   UA + blood  Cr 1.4 (pt's baseline with Alports Syndrome)  HCG neg  Hgb 12, Hct 36, Plt 267  Pelvic US- 10.9 cm uterus, two IM fibroids (2.6 and 1.5 cm), normal ovaries, ES 6 mm, IUD not visualized.   Subsequent pelvic x ray in August 2021 then determined that the IUD was expelled.   Labs on 02/10/2020 demonstrated anemia (Hgb 10, Hct 30) and normal thyroid function. She was started on iron supplementation.  For prolonged heavy bleeding she was started on an OCP taper with Spintec. She took Sprintec TID x 7 days, then started routine use of the pill pack. She reports that every Wednesday she will have bleeding for 3-5 days. She describes the bleeding as light spotting and other weeks heavy flow similar to menses. On OCPs she reports worsening  anxiety and decreased libido.  She is frustrated with the heavy and unpredictable bleeding as it is difficult to plan her work and family activities around her bleeding. She has completed child bearing and desires hysterectomy for definitive management of bleeding.   [name] is bleeding today. Reports light menstrual flow with clots. She is changing a pad every 2-3 hours. No abdominal pain/cramping.     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 Per op note, no peritoneal entry.    Pt with h/o hereditary mesangial proliferative glomerulonephritis. Following with Nephrology who cleared her for surgery.  Her baseline creatinine is 1.4-1.5.            Past Medical History:   Past Medical History:   Diagnosis Date    Anemia     On Vitron C    Anxiety     Depression     Gestational diabetes 2013    with second pregnancy - resolved    Nephritis 2012    heriditary - monitored/manged by Nephrology in [facility] 6 month follow up       Past Surgical History:    Past Surgical History:   Procedure Laterality Date    uterer Right 1990    repair    VAGINAL DELIVERY  11/30/2009    1 M, 8lb 12 oz, 38weeks 6days.    VAGINAL DELIVERY  07/15/2012    63F, 8lb 14oz, 38weeks 5 days.  Right ureteral reimplantation  Hysteroscopy, D&C, polypectomy    Past Gynecologic History:                                    Patient's last menstrual period was 05/08/2020 (exact date).  Menstrual Status: see HPI  Current contraception: OCP  Last Pap smear: 01/08/19- NIL  History of STDs or PID: pt denies    Past Obstetric History:  G2 P2002  FT SVD x 2, largest 8 lbs 14 oz    Current Medications:    No current facility-administered medications for this encounter.     Current Outpatient Medications   Medication Sig Dispense Refill    cyclobenzaprine (FLEXERIL) 10 MG tablet Take 0.5 tablets (5 mg total) by mouth every 8 (eight) hours as needed for Muscle spasms 20 tablet 0    iron-vitamin C (Vitron-C) 65-125 MG Tab Take 1  tablet by mouth every evening         loratadine (CLARITIN) 10 MG tablet Take 10 mg by mouth every evening      sertraline (ZOLOFT) 100 MG tablet Take 100 mg by mouth every evening         Sprintec 28 0.25-35 MG-MCG per tablet Take 1 tablet by mouth every evening         b complex vitamins tablet Take 1 tablet by mouth daily      Multiple Vitamins-Iron (Multivitamin Plus Iron Adult) Tab Take 1 tablet by mouth daily      vitamin D (CHOLECALCIFEROL) 25 MCG (1000 UT) tablet Take 1,000 Units by mouth daily         Allergies: Codeine    Social History:  Social History     Socioeconomic History    Marital status: Married     Spouse name: Not on file    Number of children: 2    Years of education: Not on file    Highest education level: Not on file   Occupational History    Not on file   Tobacco Use    Smoking status: Never Smoker    Smokeless tobacco: Never Used   Vaping Use    Vaping Use: Never used   Substance and Sexual Activity    Alcohol use: Not on file     Comment: Rarely    Drug use: Not Currently     Comment: CBD oil, 1 drop nightly - stopped 05/02/20 per surgeon per pt    Sexual activity: Not on file   Other Topics Concern    Not on file   Social History Narrative    Not on file     Social Determinants of Health     Financial Resource Strain:     Difficulty of Paying Living Expenses: Not on file   Food Insecurity:     Worried About Running Out of Food in the Last Year: Not on file    Ran Out of Food in the Last Year: Not on file   Transportation Needs:     Lack of Transportation (Medical): Not on file    Lack of Transportation (Non-Medical): Not on file   Physical Activity:     Days of Exercise per Week: Not on file    Minutes of Exercise per Session: Not on file   Stress:     Feeling of Stress : Not on file   Social Connections:     Frequency of Communication  with Friends and Family: Not on file    Frequency of Social Gatherings with Friends and Family: Not on file    Attends  Religious Services: Not on file    Active Member of Clubs or Organizations: Not on file    Attends Banker Meetings: Not on file    Marital Status: Not on file   Intimate Partner Violence:     Fear of Current or Ex-Partner: Not on file    Emotionally Abused: Not on file    Physically Abused: Not on file    Sexually Abused: Not on file   Housing Stability:     Unable to Pay for Housing in the Last Year: Not on file    Number of Places Lived in the Last Year: Not on file    Unstable Housing in the Last Year: Not on file         Family History:     Family History   Problem Relation Age of Onset    Arthritis Mother     Anxiety disorder Mother     Depression Mother     Hypertension Mother     Hyperlipidemia Mother     Nephritis Mother     Kidney cancer Father     Hypertension Father     Hyperlipidemia Father     Arthritis Maternal Grandmother     Hypertension Maternal Grandmother     Nephritis Maternal Grandmother     Arthritis Maternal Grandfather     Hypertension Maternal Grandfather     Diabetes type II Paternal Grandmother     Myocardial Infarction Paternal Grandmother         X2    Crohn's disease Son          Review of Systems:   Constitutional: WD, WN, in NAD, + fatigue  Eyes: no complaints  Ears/Nose/Throat: no complaints  Cardiovascular: no complaints  Respiratory: no complaints  Gastrointestinal: no complaints  Genitourinary: + aub  Musculoskeletal: no complaints  Neurologic: no complaints  Psychiatric: no complaints  HIV Risk Assessment:  negative    Objective:     BP (!) 153/95    Pulse 97    Temp 98.7 F (37.1 C) (Oral)    Resp 18    Ht 1.727 m (5\' 8" )    Wt 61.6 kg (135 lb 11.2 oz)    LMP 05/08/2020 (Exact Date)    SpO2 100%    BMI 20.63 kg/m        General Appearance: Healthy, alert, in no distress   Breast: Deferred  Neck: Neck supple, no adenopathy, thyroid symmetric & normal size  Respiratory: Lungs clear to auscultation  Cardiovascular: RRR, no murmurs, rubs or  gallops  Abdomen: Soft, non-tender. No masses, organomegaly  Skin: Negative  Neuro/Psychiatric: Sensation grossly normal  Extremities: Nontender, no edema    Pelvic: deferred to the OR    Results     Procedure Component Value Units Date/Time    Basic Metabolic Panel [[id number]]  (Abnormal) Collected: 05/30/20 1147    Specimen: Blood Updated: 05/30/20 1227     Glucose 90 mg/dL      BUN 40.9 mg/dL      Creatinine 1.7 mg/dL      Calcium 8.7 mg/dL      Sodium 811 mEq/L      Potassium 5.0 mEq/L      Chloride 106 mEq/L      CO2 23 mEq/L      Anion Gap 10.0  Narrative:      To be done within 30 days per guideline    GFR [[id number]] Collected: 05/30/20 1147     Updated: 05/30/20 1227     EGFR 33.8    Narrative:      To be done within 30 days per guideline    CBC without differential [[id number]]  (Abnormal) Collected: 05/30/20 1147    Specimen: Blood Updated: 05/30/20 1215     WBC 11.02 x10 3/uL      Hgb 10.4 g/dL      Hematocrit 01.0 %      Platelets 354 x10 3/uL      RBC 3.41 x10 6/uL      MCV 93.3 fL      MCH 30.5 pg      MCHC 32.7 g/dL      RDW 12 %      MPV 10.8 fL      Nucleated RBC 0.0 /100 WBC      Absolute NRBC 0.00 x10 3/uL     Narrative:      To be done within 30 days per guideline    ABO/Rh [[id number]] Collected: 05/30/20 1147    Specimen: Blood Updated: 05/30/20 1154    POCT Pregnancy Test, Urine HCG [[id number]] Collected: 05/30/20 1141     Updated: 05/30/20 1141     POCT QC Pass     POCT Pregnancy HCG Test, UR Negative     Comment: Negative Value is Normal in Healthy Males or Healthy non-pregnant Females              Assessment:          1) 39 yo P2002 who presents for TLH, BS, cysto, ureteral stent placement secondary to AUB failed medical management.   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     Plan:     1) Consents: The patient was consented for TLH, BS, cysto, ureteral stent placement, possible open and any other indicated procedures and blood  products. Specifically, she was counseled on risks, benefits, indications and alternatives to the above and other indicated procedures, including bleeding, infection, damage to adjacent organs, reaction to anesthesia, deep venous thrombosis, pulmonary embolism, and death. She was also counseled on risks, benefits, indications and alternatives to blood transfusion, including infection (HIV, hepatitis, CMV, unknown), transfusion reaction, allergic reaction, metabolic complications, cardiac arrhythmia, and death. Patient understands the risks, accepts the risks including but not limited to those above, and consents to the procedures. Consents signed, witnessed, and scanned into chart.    2) Pre-op Testing: T&S, CBC, BMP, UPT    3) Pre-op Antibiotics: 2 g IV Ancef    4) Anticoagulation: SCDs.      5) Diet: NPO at midnight    Anticipate inpatient surgery.     RTC in 2 and 6 weeks for post op visit.    Maryclare Labrador MD

## 2020-05-30 ENCOUNTER — Inpatient Hospital Stay (HOSPITAL_BASED_OUTPATIENT_CLINIC_OR_DEPARTMENT_OTHER): Payer: BLUE CROSS/BLUE SHIELD | Admitting: Anesthesiology

## 2020-05-30 ENCOUNTER — Encounter (HOSPITAL_BASED_OUTPATIENT_CLINIC_OR_DEPARTMENT_OTHER): Payer: Self-pay | Admitting: Obstetrics & Gynecology

## 2020-05-30 ENCOUNTER — Encounter (HOSPITAL_BASED_OUTPATIENT_CLINIC_OR_DEPARTMENT_OTHER)
Admission: RE | Disposition: A | Discharge status: Home / Self Care | Payer: Self-pay | Source: Ambulatory Visit | Attending: Obstetrics & Gynecology

## 2020-05-30 ENCOUNTER — Ambulatory Visit: Payer: Self-pay

## 2020-05-30 ENCOUNTER — Ambulatory Visit
Admission: RE | Admit: 2020-05-30 | Discharge: 2020-05-31 | Disposition: A | Discharge status: Home / Self Care | Payer: BLUE CROSS/BLUE SHIELD | Source: Ambulatory Visit | Attending: Obstetrics & Gynecology | Admitting: Obstetrics & Gynecology

## 2020-05-30 DIAGNOSIS — N838 Other noninflammatory disorders of ovary, fallopian tube and broad ligament: Secondary | ICD-10-CM | POA: Insufficient documentation

## 2020-05-30 DIAGNOSIS — D500 Iron deficiency anemia secondary to blood loss (chronic): Secondary | ICD-10-CM | POA: Insufficient documentation

## 2020-05-30 DIAGNOSIS — N921 Excessive and frequent menstruation with irregular cycle: Secondary | ICD-10-CM | POA: Insufficient documentation

## 2020-05-30 DIAGNOSIS — N736 Female pelvic peritoneal adhesions (postinfective): Secondary | ICD-10-CM | POA: Insufficient documentation

## 2020-05-30 DIAGNOSIS — N888 Other specified noninflammatory disorders of cervix uteri: Secondary | ICD-10-CM | POA: Insufficient documentation

## 2020-05-30 DIAGNOSIS — Q8781 Alport syndrome: Secondary | ICD-10-CM | POA: Insufficient documentation

## 2020-05-30 DIAGNOSIS — N1370 Vesicoureteral-reflux, unspecified: Secondary | ICD-10-CM | POA: Insufficient documentation

## 2020-05-30 DIAGNOSIS — D259 Leiomyoma of uterus, unspecified: Secondary | ICD-10-CM | POA: Insufficient documentation

## 2020-05-30 DIAGNOSIS — N938 Other specified abnormal uterine and vaginal bleeding: Secondary | ICD-10-CM | POA: Insufficient documentation

## 2020-05-30 DIAGNOSIS — N078 Hereditary nephropathy, not elsewhere classified with other morphologic lesions: Secondary | ICD-10-CM | POA: Insufficient documentation

## 2020-05-30 DIAGNOSIS — N939 Abnormal uterine and vaginal bleeding, unspecified: Secondary | ICD-10-CM

## 2020-05-30 HISTORY — DX: Anemia, unspecified: D64.9

## 2020-05-30 LAB — CBC
Absolute NRBC: 0 10*3/uL (ref 0.00–0.00)
Hematocrit: 31.8 % — ABNORMAL LOW (ref 34.7–43.7)
Hgb: 10.4 g/dL — ABNORMAL LOW (ref 11.4–14.8)
MCH: 30.5 pg (ref 25.1–33.5)
MCHC: 32.7 g/dL (ref 31.5–35.8)
MCV: 93.3 fL (ref 78.0–96.0)
MPV: 10.8 fL (ref 8.9–12.5)
Nucleated RBC: 0 /100 WBC (ref 0.0–0.0)
Platelets: 354 10*3/uL — ABNORMAL HIGH (ref 142–346)
RBC: 3.41 10*6/uL — ABNORMAL LOW (ref 3.90–5.10)
RDW: 12 % (ref 11–15)
WBC: 11.02 10*3/uL — ABNORMAL HIGH (ref 3.10–9.50)

## 2020-05-30 LAB — BASIC METABOLIC PANEL
Anion Gap: 10 (ref 5.0–15.0)
BUN: 22 mg/dL — ABNORMAL HIGH (ref 7.0–19.0)
CO2: 23 mEq/L (ref 22–29)
Calcium: 8.7 mg/dL (ref 8.5–10.5)
Chloride: 106 mEq/L (ref 100–111)
Creatinine: 1.7 mg/dL — ABNORMAL HIGH (ref 0.6–1.0)
Glucose: 90 mg/dL (ref 70–100)
Potassium: 5 mEq/L (ref 3.5–5.1)
Sodium: 139 mEq/L (ref 136–145)

## 2020-05-30 LAB — GFR: EGFR: 33.8

## 2020-05-30 LAB — POCT PREGNANCY TEST, URINE HCG: POCT Pregnancy HCG Test, UR: NEGATIVE

## 2020-05-30 LAB — ABO/RH: ABO Rh: O POS

## 2020-05-30 SURGERY — LAPAROSCOPIC, HYSTERECTOMY, TOTAL, BILATERAL SALPINGECTOMY
Anesthesia: Anesthesia General | Site: Pelvis | Wound class: Clean Contaminated

## 2020-05-30 MED ORDER — LABETALOL HCL 5 MG/ML IV SOLN (WRAP)
INTRAVENOUS | Status: DC | PRN
Start: 2020-05-30 — End: 2020-05-30
  Administered 2020-05-30: 5 mg via INTRAVENOUS

## 2020-05-30 MED ORDER — FENTANYL CITRATE (PF) 50 MCG/ML IJ SOLN (WRAP)
INTRAMUSCULAR | Status: AC
Start: 2020-05-30 — End: ?
  Filled 2020-05-30: qty 2

## 2020-05-30 MED ORDER — GLYCOPYRROLATE 0.2 MG/ML IJ SOLN (WRAP)
INTRAMUSCULAR | Status: AC
Start: 2020-05-30 — End: ?
  Filled 2020-05-30: qty 1

## 2020-05-30 MED ORDER — SERTRALINE HCL 50 MG PO TABS
100.0000 mg | ORAL_TABLET | Freq: Every evening | ORAL | Status: DC
Start: 2020-05-30 — End: 2020-05-31
  Administered 2020-05-30: 18:00:00 100 mg via ORAL
  Filled 2020-05-30: qty 2

## 2020-05-30 MED ORDER — FENTANYL CITRATE (PF) 50 MCG/ML IJ SOLN (WRAP)
INTRAMUSCULAR | Status: DC | PRN
Start: 2020-05-30 — End: 2020-05-30
  Administered 2020-05-30 (×3): 50 ug via INTRAVENOUS

## 2020-05-30 MED ORDER — SENNOSIDES-DOCUSATE SODIUM 8.6-50 MG PO TABS
1.0000 | ORAL_TABLET | Freq: Every evening | ORAL | Status: DC | PRN
Start: 2020-05-30 — End: 2020-05-31

## 2020-05-30 MED ORDER — BUPIVACAINE HCL 0.25 % IJ SOLN
INTRAMUSCULAR | Status: DC | PRN
Start: 2020-05-30 — End: 2020-05-30
  Administered 2020-05-30: 6 mL
  Administered 2020-05-30: 4 mL

## 2020-05-30 MED ORDER — NEOSTIGMINE METHYLSULFATE 1 MG/ML IJ/IV SOLN (WRAP)
Status: AC
Start: 2020-05-30 — End: ?
  Filled 2020-05-30: qty 5

## 2020-05-30 MED ORDER — OXYCODONE HCL 5 MG PO TABS
5.0000 mg | ORAL_TABLET | Freq: Once | ORAL | Status: AC | PRN
Start: 2020-05-30 — End: 2020-05-30
  Administered 2020-05-30: 5 mg via ORAL

## 2020-05-30 MED ORDER — OXYCODONE HCL 5 MG PO TABS
ORAL_TABLET | ORAL | Status: AC
Start: 2020-05-30 — End: ?
  Filled 2020-05-30: qty 1

## 2020-05-30 MED ORDER — LACTATED RINGERS IV SOLN
INTRAVENOUS | Status: DC
Start: 2020-05-30 — End: 2020-05-31

## 2020-05-30 MED ORDER — HYDROMORPHONE HCL 0.5 MG/0.5 ML IJ SOLN
0.5000 mg | INTRAMUSCULAR | Status: DC | PRN
Start: 2020-05-30 — End: 2020-05-30

## 2020-05-30 MED ORDER — ONDANSETRON HCL 4 MG/2ML IJ SOLN
INTRAMUSCULAR | Status: AC
Start: 2020-05-30 — End: ?
  Filled 2020-05-30: qty 2

## 2020-05-30 MED ORDER — MEPERIDINE HCL 25 MG/ML IJ SOLN
6.2500 mg | Freq: Once | INTRAMUSCULAR | Status: DC | PRN
Start: 2020-05-30 — End: 2020-05-30

## 2020-05-30 MED ORDER — PROPOFOL INFUSION 10 MG/ML
INTRAVENOUS | Status: DC | PRN
Start: 2020-05-30 — End: 2020-05-30
  Administered 2020-05-30: 30 ug/kg/min via INTRAVENOUS

## 2020-05-30 MED ORDER — LIDOCAINE HCL (PF) 2 % IJ SOLN
INTRAMUSCULAR | Status: AC
Start: 2020-05-30 — End: ?
  Filled 2020-05-30: qty 5

## 2020-05-30 MED ORDER — ONDANSETRON 4 MG PO TBDP
4.0000 mg | ORAL_TABLET | Freq: Three times a day (TID) | ORAL | Status: DC | PRN
Start: 2020-05-30 — End: 2020-05-31

## 2020-05-30 MED ORDER — MIDAZOLAM HCL 1 MG/ML IJ SOLN (WRAP)
INTRAMUSCULAR | Status: DC | PRN
Start: 2020-05-30 — End: 2020-05-30
  Administered 2020-05-30: 2 mg via INTRAVENOUS

## 2020-05-30 MED ORDER — LABETALOL HCL 100 MG PO TABS
100.0000 mg | ORAL_TABLET | Freq: Two times a day (BID) | ORAL | Status: DC
Start: 2020-05-30 — End: 2020-05-31
  Administered 2020-05-31: 01:00:00 100 mg via ORAL
  Filled 2020-05-30: qty 1

## 2020-05-30 MED ORDER — NEOSTIGMINE METHYLSULFATE 1 MG/ML IJ/IV SOLN (WRAP)
Status: DC | PRN
Start: 2020-05-30 — End: 2020-05-30
  Administered 2020-05-30: 3 mg via INTRAVENOUS

## 2020-05-30 MED ORDER — ACETAMINOPHEN 500 MG PO TABS
1000.0000 mg | ORAL_TABLET | Freq: Four times a day (QID) | ORAL | Status: DC
Start: 2020-05-30 — End: 2020-05-31
  Administered 2020-05-30 – 2020-05-31 (×4): 1000 mg via ORAL
  Filled 2020-05-30 (×4): qty 2

## 2020-05-30 MED ORDER — GLYCOPYRROLATE 0.2 MG/ML IJ SOLN (WRAP)
INTRAMUSCULAR | Status: DC | PRN
Start: 2020-05-30 — End: 2020-05-30
  Administered 2020-05-30: .4 mg via INTRAVENOUS

## 2020-05-30 MED ORDER — HYDROMORPHONE HCL 2 MG PO TABS
2.0000 mg | ORAL_TABLET | ORAL | Status: DC | PRN
Start: 2020-05-30 — End: 2020-05-31
  Administered 2020-05-31 (×2): 2 mg via ORAL
  Filled 2020-05-30 (×2): qty 1

## 2020-05-30 MED ORDER — BUPIVACAINE HCL (PF) 0.25 % IJ SOLN
INTRAMUSCULAR | Status: AC
Start: 2020-05-30 — End: ?
  Filled 2020-05-30: qty 10

## 2020-05-30 MED ORDER — MAGNESIUM HYDROXIDE 400 MG/5ML PO SUSP
30.0000 mL | Freq: Four times a day (QID) | ORAL | Status: DC | PRN
Start: 2020-05-30 — End: 2020-05-31

## 2020-05-30 MED ORDER — METOCLOPRAMIDE HCL 5 MG/ML IJ SOLN
10.0000 mg | Freq: Once | INTRAMUSCULAR | Status: DC | PRN
Start: 2020-05-30 — End: 2020-05-30

## 2020-05-30 MED ORDER — CEFAZOLIN SODIUM-DEXTROSE 2-3 GM-%(50ML) IV SOLR
INTRAVENOUS | Status: AC
Start: 2020-05-30 — End: ?
  Filled 2020-05-30: qty 50

## 2020-05-30 MED ORDER — GLYCOPYRROLATE 0.2 MG/ML IJ SOLN (WRAP)
INTRAMUSCULAR | Status: AC
Start: 2020-05-30 — End: ?
  Filled 2020-05-30: qty 2

## 2020-05-30 MED ORDER — SODIUM CHLORIDE 0.9% BAG (IRRIGATION USE)
INTRAVENOUS | Status: DC | PRN
Start: 2020-05-30 — End: 2020-05-30
  Administered 2020-05-30: 1000 mL
  Administered 2020-05-30: 2000 mL

## 2020-05-30 MED ORDER — BISACODYL 10 MG RE SUPP
10.0000 mg | Freq: Every day | RECTAL | Status: DC | PRN
Start: 2020-05-30 — End: 2020-05-31

## 2020-05-30 MED ORDER — PROMETHAZINE HCL 12.5 MG RE SUPP
12.5000 mg | Freq: Four times a day (QID) | RECTAL | Status: DC | PRN
Start: 2020-05-30 — End: 2020-05-31

## 2020-05-30 MED ORDER — NICARDIPINE IV BOLUS SYRINGE (ANESTHESIA)
INTRAVENOUS | Status: DC | PRN
Start: 2020-05-30 — End: 2020-05-30
  Administered 2020-05-30: .2 mg via INTRAVENOUS

## 2020-05-30 MED ORDER — HYDROMORPHONE HCL 0.5 MG/0.5 ML IJ SOLN
0.2000 mg | INTRAMUSCULAR | Status: DC | PRN
Start: 2020-05-30 — End: 2020-05-31

## 2020-05-30 MED ORDER — MORPHINE SULFATE 2 MG/ML IJ/IV SOLN (WRAP)
2.0000 mg | Status: DC | PRN
Start: 2020-05-30 — End: 2020-05-30

## 2020-05-30 MED ORDER — FAMOTIDINE 10 MG/ML IV SOLN (WRAP)
INTRAVENOUS | Status: DC | PRN
Start: 2020-05-30 — End: 2020-05-30
  Administered 2020-05-30: 20 mg via INTRAVENOUS

## 2020-05-30 MED ORDER — OXYCODONE-ACETAMINOPHEN 5-325 MG PO TABS
1.0000 | ORAL_TABLET | Freq: Once | ORAL | Status: DC | PRN
Start: 2020-05-30 — End: 2020-05-30

## 2020-05-30 MED ORDER — LACTATED RINGERS IV SOLN
INTRAVENOUS | Status: DC | PRN
Start: 2020-05-30 — End: 2020-05-30

## 2020-05-30 MED ORDER — NALOXONE HCL 0.4 MG/ML IJ SOLN (WRAP)
0.2000 mg | INTRAMUSCULAR | Status: DC | PRN
Start: 2020-05-30 — End: 2020-05-31

## 2020-05-30 MED ORDER — CEFAZOLIN SODIUM-DEXTROSE 2-3 GM-%(50ML) IV SOLR
2.0000 g | INTRAVENOUS | Status: AC
Start: 2020-05-30 — End: 2020-05-30
  Administered 2020-05-30: 13:00:00 2 g via INTRAVENOUS

## 2020-05-30 MED ORDER — ACETAMINOPHEN 325 MG PO TABS
650.0000 mg | ORAL_TABLET | Freq: Once | ORAL | Status: DC | PRN
Start: 2020-05-30 — End: 2020-05-30

## 2020-05-30 MED ORDER — STERILE WATER FOR IRRIGATION IR SOLN
Status: DC | PRN
Start: 2020-05-30 — End: 2020-05-30
  Administered 2020-05-30: 1000 mL

## 2020-05-30 MED ORDER — ROCURONIUM BROMIDE 50 MG/5ML IV SOLN
INTRAVENOUS | Status: AC
Start: 2020-05-30 — End: ?
  Filled 2020-05-30: qty 5

## 2020-05-30 MED ORDER — AMMONIA AROMATIC IN INHA
1.0000 | Freq: Once | RESPIRATORY_TRACT | Status: DC | PRN
Start: 2020-05-30 — End: 2020-05-31

## 2020-05-30 MED ORDER — SODIUM CHLORIDE 0.9 % IV SOLN
6.2500 mg | Freq: Four times a day (QID) | INTRAVENOUS | Status: DC | PRN
Start: 2020-05-30 — End: 2020-05-31

## 2020-05-30 MED ORDER — ROCURONIUM BROMIDE 10 MG/ML IV SOLN (WRAP)
INTRAVENOUS | Status: DC | PRN
Start: 2020-05-30 — End: 2020-05-30
  Administered 2020-05-30: 40 mg via INTRAVENOUS

## 2020-05-30 MED ORDER — PROPOFOL 10 MG/ML IV EMUL (WRAP)
INTRAVENOUS | Status: AC
Start: 2020-05-30 — End: ?
  Filled 2020-05-30: qty 20

## 2020-05-30 MED ORDER — ONDANSETRON HCL 4 MG/2ML IJ SOLN
INTRAMUSCULAR | Status: DC | PRN
Start: 2020-05-30 — End: 2020-05-30
  Administered 2020-05-30: 4 mg via INTRAVENOUS

## 2020-05-30 MED ORDER — DOCUSATE SODIUM 100 MG PO CAPS
200.0000 mg | ORAL_CAPSULE | Freq: Two times a day (BID) | ORAL | Status: DC
Start: 2020-05-30 — End: 2020-05-31
  Administered 2020-05-30 – 2020-05-31 (×2): 200 mg via ORAL
  Filled 2020-05-30 (×2): qty 2

## 2020-05-30 MED ORDER — LIDOCAINE HCL 2 % IJ SOLN
INTRAMUSCULAR | Status: DC | PRN
Start: 2020-05-30 — End: 2020-05-30
  Administered 2020-05-30: 50 mg

## 2020-05-30 MED ORDER — SIMETHICONE 80 MG PO CHEW
80.0000 mg | CHEWABLE_TABLET | Freq: Four times a day (QID) | ORAL | Status: DC | PRN
Start: 2020-05-30 — End: 2020-05-31
  Administered 2020-05-31 (×2): 80 mg via ORAL
  Filled 2020-05-30 (×2): qty 1

## 2020-05-30 MED ORDER — STERILE WATER FOR IRRIGATION IR SOLN
Status: DC | PRN
Start: 2020-05-30 — End: 2020-05-30
  Administered 2020-05-30: 100 mL

## 2020-05-30 MED ORDER — LACTATED RINGERS IV SOLN
125.0000 mL/h | INTRAVENOUS | Status: AC
Start: 2020-05-30 — End: 2020-05-30

## 2020-05-30 MED ORDER — PROPOFOL 10 MG/ML IV EMUL (WRAP)
INTRAVENOUS | Status: AC
Start: 2020-05-30 — End: ?
  Filled 2020-05-30: qty 100

## 2020-05-30 MED ORDER — FENTANYL CITRATE (PF) 50 MCG/ML IJ SOLN (WRAP)
25.0000 ug | INTRAMUSCULAR | Status: DC | PRN
Start: 2020-05-30 — End: 2020-05-30

## 2020-05-30 MED ORDER — ONDANSETRON HCL 4 MG/2ML IJ SOLN
4.0000 mg | Freq: Three times a day (TID) | INTRAMUSCULAR | Status: DC | PRN
Start: 2020-05-30 — End: 2020-05-31

## 2020-05-30 MED ORDER — ONDANSETRON HCL 4 MG/2ML IJ SOLN
4.0000 mg | Freq: Once | INTRAMUSCULAR | Status: AC | PRN
Start: 2020-05-30 — End: 2020-05-30
  Administered 2020-05-30: 16:00:00 4 mg via INTRAVENOUS

## 2020-05-30 MED ORDER — PROPOFOL 10 MG/ML IV EMUL (WRAP)
INTRAVENOUS | Status: DC | PRN
Start: 2020-05-30 — End: 2020-05-30
  Administered 2020-05-30: 200 mg via INTRAVENOUS

## 2020-05-30 MED ORDER — AMMONIA AROMATIC IN INHA
1.0000 | Freq: Once | RESPIRATORY_TRACT | Status: DC | PRN
Start: 2020-05-30 — End: 2020-05-30

## 2020-05-30 MED ORDER — PROMETHAZINE HCL 25 MG PO TABS
12.5000 mg | ORAL_TABLET | Freq: Four times a day (QID) | ORAL | Status: DC | PRN
Start: 2020-05-30 — End: 2020-05-31

## 2020-05-30 MED ORDER — MIDAZOLAM HCL 1 MG/ML IJ SOLN (WRAP)
INTRAMUSCULAR | Status: AC
Start: 2020-05-30 — End: ?
  Filled 2020-05-30: qty 2

## 2020-05-30 SURGICAL SUPPLY — 89 items
APPLICATOR CHLORAPREP 26 ML 70% ISOPROPYL ALCOHOL 2% CHLORHEXIDINE (Applicator) ×2 IMPLANT
APPLICATOR PRP 70% ISPRP 2% CHG 26ML (Applicator) ×3
BANDAGE ADHESIVE L3/8 IN X W3/8 IN SPOT (Bandage) ×6
BANDAGE ADHSV 3/8X3/8IN SPOT SHR THIN RND CURITY PLSTC OD7/8 IN (Bandage) ×6 IMPLANT
BANDAGE BANDAID SPOT 7/8IN (Bandage) ×3
CATHETER SURGICAL OD6 FR COLPO-PNEUMO (Procedure Accessories) ×2
COVER MAYO CNVRT STND 23IN PLS REINF (Drape) ×1
COVER STAND W23 IN REINFORCE PLASTIC (Drape) ×2 IMPLANT
COVER STND PLS MAYO CNVRT 23IN LF STRL (Drape) ×2
COVER TABLE L90 IN X W65 IN REINFORCE (Drape) ×2
COVER TABLE L90 IN X W65 IN REINFORCE HEAVY DUTY CONVERTORS POLY (Drape) ×2 IMPLANT
COVER TBL POLY CNVRT 90X65IN STRL REINF (Drape) ×1
DEVICE PUREVIEW PLUME FILTER (Procedure Accessories) ×1
ELECTRODE ADULT PATIENT RETURN L9 FT REM POLYHESIVE ACRYLIC FOAM (Procedure Accessories) ×2 IMPLANT
ELECTRODE ELECTROSURGICAL CURVE SPATULA (Procedure Accessories) ×2
ELECTRODE ELECTROSURGICAL CURVE SPATULA L36 CM CLEANCOAT PTFE COATED (Procedure Accessories) ×2 IMPLANT
ELECTRODE ESURG PTFE CRV SPAT CLEANCOAT (Procedure Accessories) ×1
ELECTRODE PATIENT RETURN L9 FT VALLEYLAB (Procedure Accessories) ×2
ELECTRODE PT RTN RM PHSV ACRL FM C30- LB (Procedure Accessories) ×1
GLOVE SRG NTR RBR 7 BGL IND 288X91MM LTX (Glove) ×1
GLOVE SRG PLISPRN 6.5 BGL PI MIC 285MM (Glove) ×6 IMPLANT
GLOVE SRG PLISPRN 7.5 BGL PI MIC LF STRL (Glove) ×1
GLOVE SURGICAL 7 BIOGEL INDICATOR POWDER (Glove) ×2
GLOVE SURGICAL 7 BIOGEL INDICATOR POWDER FREE SMOOTH BEAD CUFF (Glove) ×2 IMPLANT
GLOVE SURGICAL 7.5 BIOGEL PI MICRO (Glove) ×2
GLOVE SURGICAL 7.5 BIOGEL PI MICRO POWDER FREE ROUGH BEAD CUFF (Glove) ×2 IMPLANT
IRRIGATOR SUCTION ERGONOMIC HAND PIECE STRYKEFLOW II (Suction) ×2 IMPLANT
IRRIGATOR SUCTION STRYKEFLOW 2 (Suction) ×2
KIT PSTN 1STP PNK PD NS 1 LFT SHT 1 STP (Positioning Supplies) ×3 IMPLANT
MANIPULATOR CERVIX UTERUS OD32 MM (Retractor)
MANIPULATOR ESCP SM VCARE 32MM LF STRL 2 (Retractor)
MANIPULATOR UT LG VCARE 37MM LF STRL 2 (Procedure Accessories)
MANIPULATOR UT MED VCARE + 34MM LF STRL (Procedure Accessories)
MANIPULATOR UT XL VCARE + 40MM CUP CRV (Retractor) ×1
MANIPULATOR UTERINE 34MM VCARE PLUS MD CUP STRL LF DSPSBL (Procedure Accessories) IMPLANT
MANIPULATOR UTERINE LARGE 2 CUP LOCK (Procedure Accessories)
MANIPULATOR UTERINE LG 2 CUP LCK SCRW BLLN 37MM VCR CRVCL HYSTERECTOMY (Procedure Accessories) IMPLANT
MANIPULATOR UTERINE OD6 FR COLPO-PNEUMO OCCLUDER SILICONE (Procedure Accessories) ×2 IMPLANT
MANIPULATOR UTERINE SMALL CUP OD32 MM VCARE PLUS CERVICAL (Retractor) IMPLANT
MANIPULATOR UTERINE XL CUP OD40 MM VCARE (Retractor) ×2
MANIPULATOR UTERINE XL CUP OD40 MM VCARE PLUS CERVICAL (Retractor) ×2 IMPLANT
NEEDLE INSFL SS PLS STD SRGNDL 14GA 120 (Needles)
NEEDLE INSUFFLATION 150MM (Needles)
NEEDLE INSUFFLATION L120 MM OD14 GA (Needles)
NEEDLE INSUFFLATION L120 MM OD14 GA STANDARD SURGINEEDLE (Needles) IMPLANT
NEEDLE INSUFFLATION L150 MM OD14 GA LONG (Needles)
NEEDLE INSUFFLATION L150 MM OD14 GA LONG SURGINEEDLE PNEUMOPERITONEUM (Needles) IMPLANT
OCCLUDER COLPO PNEUMO (Procedure Accessories) ×1
PACK SURGICAL TLH ONCOLOGY (Pack) ×2
PAD SANITARY L12.25 IN X W4.25 IN HEAVY ABSORBENT MOISTURE BARRIER (Dressing) ×2 IMPLANT
PAD SNTR SLK FLF CRTY 12.25X4.25IN LF NS (Dressing) ×3
PENCIL ELECTRO PUSH BUTTON (Cautery) ×3 IMPLANT
SEALER/DIVIDER LAPAROSCOPIC L37 CM 180 D (Procedure Accessories) ×2
SEALER/DIVIDER LAPAROSCOPIC L37 CM 180 D L17.8 MM 2 ACTION JAW BLUNT (Procedure Accessories) ×2 IMPLANT
SEALER/DIVIDER LAPSCP 180D 17.8MM LGSR 5 (Procedure Accessories) ×1
SET IRR DEHP 10 GTT/ML STRG 81IN LF STRL (Tubing) ×1
SET IRRIGATION L81 IN 10 GTT/ML STRAIGHT (Tubing) ×2
SET IRRIGATION L81 IN 10 GTT/ML STRAIGHT NA DEHP BLADDER REGULATE (Tubing) ×2 IMPLANT
SLEEVE LAPSCP 5MM 100MM EPTH XCL UNV STAB CNN (Procedure Accessories) ×2
SLEEVE LAPSCP UNV EPTH XCL 5MM 100MM (Procedure Accessories) ×4
SLEEVE TROCAR L100 MM UNIVERSAL STABILITY OD5 MM ENDOPATH XCEL (Procedure Accessories) ×4 IMPLANT
STRIP SKIN CLOSURE L4 IN X W1/2 IN (Dressing) ×2
STRIP SKIN CLOSURE L4 IN X W1/2 IN REINFORCE STERI-STRIP POLYESTER (Dressing) ×2 IMPLANT
STRIP SKNCLS PLSTR STRSTRP 4X.5IN LF (Dressing) ×1
SUTURE ABS 0 CT1 VCL 27IN BRD COAT UD (Suture) ×1
SUTURE ABS 0 CT1 VCL 27IN CR BRD 8 STRN (Suture) ×1
SUTURE COATED VICRYL 0 CT-1 L27 IN (Suture) ×2 IMPLANT
SUTURE COATED VICRYL 0 CT-1 L27 IN BRAID (Suture) ×2
SUTURE COATED VICRYL 0 CT-1 L27 IN BRAID COATED UNDYED ABSORBABLE (Suture) ×2 IMPLANT
SYSTEM IMAGING 8X6IN CLEARIFY MICROFIBER WARM HUB TRCR WIPE DSPSBL (Kits) ×2 IMPLANT
SYSTEM IMG MRFBR CLEARIFY 8X6IN WRM HUB (Kits) ×3
TIP SCISSORS ENDOCUT L19.3 MM (Instrument) ×2 IMPLANT
TIP SCSR ENDCT 19.3MM DISP (Instrument) ×1
TOWEL L27 IN X W17 IN COTTON PREWASH (Other) ×2
TOWEL L27 IN X W17 IN COTTON PREWASH DELINT HIGH ABSORBENT BLUE (Other) ×2 IMPLANT
TOWEL SRG CTTN 27X17IN LF STRL PREWASH (Other) ×1
TRAY SURGICAL TLH ONCOLOGY (Pack) ×2 IMPLANT
TRAY TLH ONCOLOGY FFX (Pack) ×1
TROCAR LAPAROSCOPIC STABILITY SLEEVE BLADELESS OBTURATOR L100 MM OD11 (Laparoscopy Supplies) IMPLANT
TROCAR LAPSCP EPTH XCL 11MM 100MM LF (Laparoscopy Supplies)
TROCAR LAPSCP EPTH XCL 5MM 100MM LF STRL (Laparoscopy Supplies) ×3
TROCAR OD5 MM L100 MM BLADELESS STABLE SLEEVE ENDOPATH XCEL ENDOSCOPIC (Laparoscopy Supplies) ×2 IMPLANT
TUBE SET DISP HIGH FLOW (Tubing) ×1
TUBING INSUFFLATION SET HIGH FLOW (Tubing) ×2
TUBING INSUFFLATION SET HIGH FLOW TOUCHSCREEN PNEUMOSURE THERMOPLASTIC (Tubing) ×2 IMPLANT
TUBING SCT IRR (Suction) ×1
TUBING SMOKE EVACUATOR FILTRATION (Procedure Accessories) ×2
TUBING SMOKE EVACUATOR FILTRATION PLUME-AWAY PUREVIEW LAPAROSCOPIC (Procedure Accessories) ×2 IMPLANT
urethreal catheter, open ended ×3 IMPLANT

## 2020-05-30 NOTE — PACU (Signed)
Pt's husband called and updated x 2.

## 2020-05-30 NOTE — Brief Op Note (Addendum)
BRIEF OP NOTE    Date Time: 05/30/20 1:54 PM    Patient Name:   Gabrielle Dean    Date of Operation:   05/30/2020    Providers Performing:   Surgeon(s):  Fanny Skates, MD  Cothran, Arlyn Dunning, MD  Kendrick Fries, MD    Assistant (s):   Circulator: Hildred Laser, RN  Scrub Person: Weston Settle R  Team Leader: Lollie Marrow, RN    Operative Procedure:   Cystoscopy, bilateral ureteral catheter placement     Preoperative Diagnosis:   Pre-Op Diagnosis Codes:     * Abnormal uterine bleeding [N93.9]    Postoperative Diagnosis:   Post-Op Diagnosis Codes:     * Abnormal uterine bleeding [N93.9]    Anesthesia:   Choice    Estimated Blood Loss:    * No values recorded between 05/30/2020  1:12 PM and 05/30/2020  1:54 PM *    Implants:   * No implants in log *    Drains:   Drains: Yes, B 5 Fr ureteral catheters, 16 Fr foley     Specimens:   * No specimens in log *      Findings:   See dictation     Complications:   none      Signed by: Kendrick Fries, MD                                                                           [facility]

## 2020-05-30 NOTE — Transfer of Care (Signed)
Anesthesia Transfer of Care Note    Patient: Gabrielle Dean    Procedures performed: Procedure(s):  LAPAROSCOPIC HYSTERECTOMY BILATERAL SALPINGECTOMY  CYSTOSCOPY URETERAL STENT PLACEMENT    Anesthesia type: General ETT    Patient location:Phase I PACU    Last vitals:   Vitals:    05/30/20 1128   BP: (!) 153/95   Pulse: 97   Resp: 18   Temp: 37.1 C (98.7 F)   SpO2: 100%       Post pain: Patient not complaining of pain, continue current therapy      Mental Status:sedated    Respiratory Function: tolerating face mask    Cardiovascular: stable    Nausea/Vomiting: patient not complaining of nausea or vomiting    Hydration Status: adequate    Post assessment: no apparent anesthetic complications    Signed by: Mittie Bodo, CRNA  05/30/20 3:32 PM  197/106-nicardipine given, repeat-163/89, 74, 1005, etco2 monitored

## 2020-05-30 NOTE — Op Note (Signed)
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 Carroll Sage, Urology.   Total laparoscopic hysterectomy, bilateral salpingectomy.     SURGEON:   Maryclare Labrador MD  Sharyne Peach MD    ANESTHESIOLOGIST: Felicita Gage MD  ANESTHESIA ASSISTANT: Angelita Ingles CRNA    ANESTHESIA: GETA    EBL: 150 mL mL  UOP: 250 mL  FLUID REPLACEMENT: 1300 mL    FINDINGS:   On exam under anesthesia, the uterus was enlarged, 10 weeks size, and mobile.   On laparoscopy, there was an enlarged 10 cm uterus suggestive of adenomyosis. Normal fallopian tubes and ovaries bilaterally. Normal liver edge. Thin adhesions from the sigmoid colon to the left pelvic side wall, cephalad and superior to the infundibulopelvic ligament. Bilateral ureters with stents visualized along the pelvic side walls throughout the hysterectomy.    SPECIMENS TO PATHOLOGY:  1. Cervix, Uterus, Bilateral fallopian tubes    INDICATIONS: Gabrielle Dean is a 39 y.o. female para 2002 with abnormal uterine bleeding failed medical management and anemia due to chronic blood loss who presents for definitive management of bleeding with hysterectomy. Patient with history of right ureteral reimplantation due to reflux. Ureteral stent placement by Urology requested for intra-op ureter identification.    DESCRIPTION OF THE PROCEDURE: The patient was identified and brought to the Operating Room and placed on the operating table in the dorsal supine position.  A briefing  was held and the patient's identity and planned surgical procedure were confirmed.  After the establishment of adequate general endotracheal anesthesia, the patient was placed in low lithotomy position in [place] stirrups and an examination under anesthesia was performed with the above-noted findings. A time-out was taken and the patient's identity and planned surgical procedure were confirmed.  It was confirmed that the patient had received prophylactic antibiotics and that pneumatic compression stockings were in place and functioning.      The patient was prepped and draped in the usual sterile manner. An OG tube was placed by anesthesia to decompress the stomach.  Cystoscopy was performed by Dr. Carroll Sage and bilateral ureteral stents were placed. Please see her operative report for further detail. A foley catheter was placed and the stents were secured to the foley catheter.  A weighted speculum was placed in the vagina, and the cervix was exposed. The anterior lip of the cervix was grasped with an allis. A V-care uterine manipulator was placed for uterine manipulation. The allis and weighted speculum were removed from the vagina.     Attention was then turned to laparoscopic entry into the abdomen. The umbilicus was identified, and was injected with 0.25% Marcaine plain. A 5 mm incision was made at the base of the umbilicus. The Veress needle was inserted at the base of umbilicus, through the incision. The opening pressure of the abdomen was 3 mm Hg. The abdomen was then insufflated to a pressure of 15 mm. A 5-mm trochar was placed under direct visualization with 5 mm  laparoscope. Intraabdominal location was confirmed. There was no evidence of injury from entry. A diagnostic laparoscopy was performed with findings noted above and the decision was made to proceed with laparoscopic hysterectomy. Two 5-mm lateral ports were placed after infiltrating the planned port sites with 0.25% Marcaine plain and making  appropriately sized skin incisions. The bowel was retracted out of the pelvis. The left round ligament was clamped, cauterized and divided using the Ligasure  The anterior leaf of the broad ligament and the vesicouterine fold on the left side were progressively clamped, cauterized and divided to the midpoint of the cervix. The left fimbria ovarica and mesosalpinx was clamped, cauterized, and divided for removal of the fallopian tube. The posterior leaf of the broad ligament was similarly clamped, cut and divided to near the insertion of the uterosacral ligament with the uterus.  The same process was repeated on the opposite side.  The bladder was separated from the underlying cervix with gentle blunt dissection.  The right uterine vessels were progressively clamped, cauterized and divided.  This process was repeated on the left side.  Good blanching of the uterus was noted.  The bovie spatula was introduced into the abdomen and, a posterior colpotomy was made from the 4 o'clock to the 8 o'clock position.  The colpotomy was completed circumferentially with the bovie spatula.  The uterus, bilateral fallopian tubes, and cervix were removed through the vagina.  All laparoscopic instruments were removed and the pneumoperitoneum was released.    The patient was repositioned in high lithotomy position and attention was turned to the vagina. A weighted speculum was placed in the vagina and the anterior vagina and bladder were retracted with a right angle retractor.  The uterosacral ligaments and vaginal mucosa near the ligaments were grasped with Allis clamps and the vaginal mucosa was secured to the uterosacral ligaments with figure-of-eight sutures of 0 Vicryl bilaterally.  The vaginal cuff was closed with figure of eight sutures of 0 Vicryl.  The vaginal cuff was intact to palpation. The vagina was irrigated and adequate hemostasis was assured.     The patient was repositioned in low lithotomy position and attention was  turned back to the abdomen.  A pneumoperitoneum was reestablished, the laparoscope and suction irrigator were reinserted. The pelvis was irrigated and hemostasis was assured.  The lateral ports were removed under direct visualization and adequate hemostasis was assured.  The laparoscope was withdrawn and the pneumoperitoneum was released.  The intraumbilical port was removed.  The skin incisions were all closed with interrupted sutures of 3-0 Monocryl.     The bilateral ureteral stents and foley catheter were removed. A sterile foley catheter was replaced in the bladder for post operative ins/out monitoring. The patient was awakened from anesthesia and transferred to the PACU in stable condition.  A debriefing was held at the completion of the procedure; all counts were correct and no isses were identified.      COMPLICATIONS: None    Maryclare Labrador MD

## 2020-05-30 NOTE — Plan of Care (Signed)
Patient arrived to [facility] from PACU around 1730. Patient is alert and oriented. Vital signs stable. Patient ambulated from stretcher to bed. Patient reporting mild discomfort with the foley in place. The foley is draining, pericare done. PIV fluids infusing. X3 Lap sites with steri strips and adhesive bandages, CDI. Patient oriented to room, call bell, diet, and plan. Call bell in reach. Fall mat in place. Instructed patient to call for further assistance.     Problem: Safety  Goal: Patient will be free from injury during hospitalization  Outcome: Progressing  Flowsheets (Taken 05/30/2020 1744)  Patient will be free from injury during hospitalization:   Provide and maintain safe environment   Ensure appropriate safety devices are available at the bedside   Assess patient's risk for falls and implement fall prevention plan of care per policy   Use appropriate transfer methods   [place] rounding  Goal: Patient will be free from infection during hospitalization  Outcome: Progressing  Flowsheets (Taken 05/30/2020 1744)  Free from Infection during hospitalization:   Encourage patient and family to use good hand hygiene technique   Monitor all insertion sites (i.e. indwelling lines, tubes, urinary catheters, and drains)   Monitor lab/diagnostic results   Assess and monitor for signs and symptoms of infection     Problem: Pain  Goal: Pain at adequate level as identified by patient  Outcome: Progressing  Flowsheets (Taken 05/30/2020 1744)  Pain at adequate level as identified by patient:   Identify patient comfort function goal   Evaluate patient's satisfaction with pain management progress   Offer non-pharmacological pain management interventions   Evaluate if patient comfort function goal is met   Assess pain on admission, during daily assessment and/or before any "as needed" intervention(s)   Assess for risk of opioid induced respiratory depression, including snoring/sleep apnea. Alert healthcare team of risk factors  identified.

## 2020-05-30 NOTE — Op Note (Signed)
Procedure Date:May 30, 2020       Patient Type: I     SURGEON: Leandrew Koyanagi MD  ASSISTANT:       PREOPERATIVE DIAGNOSIS:  Abnormal uterine bleeding     POSTOPERATIVE DIAGNOSIS:  Abnormal uterine bleeding     TITLE OF PROCEDURE:  Cystoscopy, bilateral ureteral catheter placement.     ANESTHESIA:  General.     BLOOD LOSS:  Minimal.     FLUIDS:  Crystalloid.     DRAINS:  Bilateral 5-French ureteral catheter and 16-French Foley.     SPECIMENS:  None.     COMPLICATIONS:  None.     DISPOSITION:  The case was turned over to Dr. Malen Gauze m to perform her portion of the  procedure.     INDICATIONS FOR PROCEDURE:  This is a pleasant 39 year old female with a history of diverticulitis,  undergoing a robotic-assisted laparoscopic sigmoid colectomy.  I was asked  to place ureteral catheters for the purpose of intraoperative localization  of the ureters.     DESCRIPTION OF PROCEDURE:  The patient was met preoperatively.  Written informed consent was obtained.   She was brought to the operating suite, placed on the operating table in  supine position.  After the induction of anesthesia, she was repositioned to  relaxed dorsal lithotomy position with all pressure points carefully  padded.  The patient was prepped and draped in normal sterile fashion.   Appropriate intravenous antibiotics were administered according to the  colorectal team and a surgical timeout was performed, which was successful.     I began the procedure with a rigid cystoscope equipped with a 22-French  sheath and 30-degree lens.  I entered the patient's urethra, which was  normal without any areas of narrowing.  I then entered the patient's  bladder.  Formal cystoscopy was performed and no stones, diverticula, or  masses were seen. Bilateral ureteral orifices were identified in the orthotopic position, and patulous appearing.  I turned my attention to the right ureteral orifice and  intubated this with a Sensor wire that was passed to the level of  the  kidney.  A 5-French open-ended catheter was then passed over top without  any resistance.  I turned my attention to the left-hand side and performed  the same procedure.  Again, a Sensor wire was passed to the level of the  kidney and a 5-French open-ended catheter was passed over top without any  resistance.  The cystoscope was removed and a 16-French Foley catheter was  placed.  The stents were affixed to the Foley and both to straight  drainage.  The case was then turned over to Dr. Malen Gauze to perform her portion  of the procedure.

## 2020-05-30 NOTE — Anesthesia Preprocedure Evaluation (Signed)
Anesthesia Evaluation    AIRWAY    Mallampati: II    TM distance: <3 FB  Neck ROM: full  Mouth Opening:full   CARDIOVASCULAR    regular and normal       DENTAL    no notable dental hx     PULMONARY    clear to auscultation     OTHER FINDINGS    Abnormal uterine bleeding  Anxious                Relevant Problems   GU/RENAL   (+) Hereditary nephritis               Anesthesia Plan    ASA 2     general                     Detailed anesthesia plan: general endotracheal            informed consent obtained      pertinent labs reviewed             Signed by: Joselyn Arrow, MD 05/30/20 11:34 AM

## 2020-05-30 NOTE — Anesthesia Postprocedure Evaluation (Signed)
Anesthesia Post Evaluation    Patient: Gabrielle Dean    Procedure(s):  LAPAROSCOPIC HYSTERECTOMY BILATERAL SALPINGECTOMY  CYSTOSCOPY URETERAL STENT PLACEMENT    Anesthesia type: general    Last Vitals:   Vitals Value Taken Time   BP 169/93 05/30/20 1540   Temp 36.2 C (97.2 F) 05/30/20 1530   Pulse 73 05/30/20 1540   Resp 14 05/30/20 1540   SpO2 100 % 05/30/20 1540                 Anesthesia Post Evaluation:     Patient Evaluated: PACU  Patient Participation: complete - patient participated  Level of Consciousness: awake and alert  Pain Score: 1  Pain Management: adequate  Multimodal analgesia pain management approach    Airway Patency: patent    Anesthetic complications: No      PONV Status: none    Cardiovascular status: acceptable  Respiratory status: acceptable  Hydration status: acceptable        Signed by: Joselyn Arrow, MD, 05/30/2020 3:50 PM

## 2020-05-30 NOTE — Brief Op Note (Signed)
BRIEF OP NOTE    Date Time: 05/30/20 3:15 PM    Patient Name:   Gabrielle Dean    Date of Operation:   05/30/2020    Providers Performing:   Surgeon(s):  Fanny Skates, MD  Cothran, Arlyn Dunning, MD  Kendrick Fries, MD    Assistant (s):   Circulator: Hildred Laser, RN  Scrub Person: Graceann Congress  Team Leader: Lollie Marrow, RN    Operative Procedure:   Procedure(s):  LAPAROSCOPIC HYSTERECTOMY BILATERAL SALPINGECTOMY  CYSTOSCOPY URETERAL STENT PLACEMENT    Preoperative Diagnosis:   Pre-Op Diagnosis Codes:     * Abnormal uterine bleeding [N93.9]    Postoperative Diagnosis:   Post-Op Diagnosis Codes:     * Abnormal uterine bleeding [N93.9]    Anesthesia:   Choice    Estimated Blood Loss:    150 mL    Implants:   * No implants in log *    Drains:   Drains: Yes     Specimens:     ID Type Source Tests Collected by Time Destination   A : uterus, cervix and bilateral fallopian tubes Not Applicable Uterus and Cervix SURGICAL PATHOLOGY Fanny Skates, MD 05/30/2020 1427          Findings:   Enlarged uterus, normal fallopian tubes bilaterally, normal ovaries bilaterally.   Ureters with stents visualized bilaterally along pelvic side wall.   Thin adhesions from the sigmoid colon to the pelvic side wall.     Complications:   None      Signed by: Fanny Skates, MD                                                                           [facility]

## 2020-05-30 NOTE — PACU (Signed)
Dr. Gustavo Lah, anesthesia, called.  Pt's blood pressure reported. No orders received.   Pt cleared to go to gyn unit per Dr. Gustavo Lah.

## 2020-05-31 ENCOUNTER — Encounter (HOSPITAL_BASED_OUTPATIENT_CLINIC_OR_DEPARTMENT_OTHER): Payer: Self-pay | Admitting: Obstetrics & Gynecology

## 2020-05-31 LAB — CBC AND DIFFERENTIAL
Absolute NRBC: 0 10*3/uL (ref 0.00–0.00)
Basophils Absolute Automated: 0.03 10*3/uL (ref 0.00–0.08)
Basophils Automated: 0.2 %
Eosinophils Absolute Automated: 0 10*3/uL (ref 0.00–0.44)
Eosinophils Automated: 0 %
Hematocrit: 27 % — ABNORMAL LOW (ref 34.7–43.7)
Hgb: 8.9 g/dL — ABNORMAL LOW (ref 11.4–14.8)
Immature Granulocytes Absolute: 0.06 10*3/uL (ref 0.00–0.07)
Immature Granulocytes: 0.4 %
Lymphocytes Absolute Automated: 0.95 10*3/uL (ref 0.42–3.22)
Lymphocytes Automated: 6.7 %
MCH: 30 pg (ref 25.1–33.5)
MCHC: 33 g/dL (ref 31.5–35.8)
MCV: 90.9 fL (ref 78.0–96.0)
MPV: 10.8 fL (ref 8.9–12.5)
Monocytes Absolute Automated: 0.72 10*3/uL (ref 0.21–0.85)
Monocytes: 5.1 %
Neutrophils Absolute: 12.46 10*3/uL — ABNORMAL HIGH (ref 1.10–6.33)
Neutrophils: 87.6 %
Nucleated RBC: 0 /100 WBC (ref 0.0–0.0)
Platelets: 320 10*3/uL (ref 142–346)
RBC: 2.97 10*6/uL — ABNORMAL LOW (ref 3.90–5.10)
RDW: 13 % (ref 11–15)
WBC: 14.22 10*3/uL — ABNORMAL HIGH (ref 3.10–9.50)

## 2020-05-31 LAB — BASIC METABOLIC PANEL
Anion Gap: 9 (ref 5.0–15.0)
BUN: 18 mg/dL (ref 7.0–19.0)
CO2: 22 mEq/L (ref 22–29)
Calcium: 8 mg/dL — ABNORMAL LOW (ref 8.5–10.5)
Chloride: 101 mEq/L (ref 100–111)
Creatinine: 1.6 mg/dL — ABNORMAL HIGH (ref 0.6–1.0)
Glucose: 135 mg/dL — ABNORMAL HIGH (ref 70–100)
Potassium: 4.8 mEq/L (ref 3.5–5.1)
Sodium: 132 mEq/L — ABNORMAL LOW (ref 136–145)

## 2020-05-31 LAB — GFR: EGFR: 36.3

## 2020-05-31 MED ORDER — LISINOPRIL 10 MG PO TABS
10.0000 mg | ORAL_TABLET | Freq: Every day | ORAL | Status: DC
Start: 2020-05-31 — End: 2020-05-31
  Administered 2020-05-31: 12:00:00 10 mg via ORAL
  Filled 2020-05-31: qty 1

## 2020-05-31 MED ORDER — LISINOPRIL 10 MG PO TABS
10.0000 mg | ORAL_TABLET | Freq: Every day | ORAL | 0 refills | Status: AC
Start: 2020-06-01 — End: ?

## 2020-05-31 MED ORDER — HYDROMORPHONE HCL 2 MG PO TABS
2.0000 mg | ORAL_TABLET | ORAL | 0 refills | Status: DC | PRN
Start: 2020-05-31 — End: 2020-06-14

## 2020-05-31 NOTE — Plan of Care (Signed)
Alert and oriented x4 denies dizziness  Pain controlled with tylenol and PRN dilaudid. High BP treated with new order of labetalol, IV LR d/ced.  Patient on regular diet, -N/V   Foley in place, draining adequate output  3 lap sites noted with steri strips and bandaids; incision clean, dry and intact.   SCDs and nonskid socks on  Ambulate in hallways with minimal assist  Will continue with plan of care    Problem: Safety  Goal: Patient will be free from injury during hospitalization  Outcome: Progressing  Goal: Patient will be free from infection during hospitalization  Outcome: Progressing     Problem: Pain  Goal: Pain at adequate level as identified by patient  Outcome: Progressing     Problem: Side Effects from Pain Analgesia  Goal: Patient will experience minimal side effects of analgesic therapy  Outcome: Progressing     Problem: Discharge Barriers  Goal: Patient will be discharged home or other facility with appropriate resources  Outcome: Progressing     Problem: Psychosocial and Spiritual Needs  Goal: Demonstrates ability to cope with hospitalization/illness  Outcome: Progressing     Problem: Moderate/High Fall Risk Score >5  Goal: Patient will remain free of falls  Outcome: Progressing

## 2020-05-31 NOTE — Plan of Care (Signed)
Post op milestones are being met. Blood pressure still elevated, MD is aware and requests the patient to follow up with PCP regarding medications. This RN instructed patient to follow up with PCP in 1 week for blood pressure and BMP lab draw. Discharge order placed. IV removed, catheter intact. Discharged with written and verbal instructions. Educated patient on post op care, activity, diet, and what to monitor for. Patient to pick up medications at pharmacy, medications explained. Patient verbalized understanding and did not have any further questions. Patient discharged home with husband at approximately 1530.     Problem: Safety  Goal: Patient will be free from injury during hospitalization  Outcome: Progressing  Goal: Patient will be free from infection during hospitalization  Outcome: Progressing     Problem: Pain  Goal: Pain at adequate level as identified by patient  Outcome: Progressing     Problem: Discharge Barriers  Goal: Patient will be discharged home or other facility with appropriate resources  Outcome: Progressing

## 2020-05-31 NOTE — Progress Notes (Signed)
PROGRESS NOTE    Date Time: 05/31/20 11:15 AM  Patient Name: Gabrielle Dean, Gabrielle Dean      Assessment:   POD#1 s/p cystoscopy and bilateral ureteral stent placement/TLH/Bilateral Salpingectomy    Plan:   Pt has been hypertensive since her surgery.  Was started on labetalol 100mg  BID overnight.  Spoke with [facility] nephrologist (pt's nephrologist in [place]).  Will change to lisinopril 10mg  PO q day with a goal BP of 130s/80s.  Will need an outpatient BMP in 1 week.  UOP adequate overnight and Cr at baseline.  Will remove foley and initiate voiding trial.  D/C home when voiding trial complete and BPs controlled.    Subjective:   Pt reports she is feeling well.  Her pain is controlled with only tylenol.  She is passing flatus, ambulating, and tolerating PO.  Her foley is in place.  She has been hypertensive since her surgery and was started on labetalol 100mg  BID overnight.     Medications:     Current Facility-Administered Medications   Medication Dose Route Frequency    acetaminophen  1,000 mg Oral 4 times per day    docusate sodium  200 mg Oral BID    labetalol  100 mg Oral Q12H SCH    sertraline  100 mg Oral QPM       Physical Exam:     Vitals:    05/31/20 0716   BP: 146/84   Pulse: 83   Resp: 15   Temp: 98.2 F (36.8 C)   SpO2: 97%       Intake and Output Summary (Last 24 hours) at Date Time    Intake/Output Summary (Last 24 hours) at 05/31/2020 1115  Last data filed at 05/31/2020 0735  Gross per 24 hour   Intake 1300 ml   Output 1500 ml   Net -200 ml     Gen NAD, WDWN  Abd soft, app tender to palpation, nondistended, no rebound, no guarding, incisions bandaged and C/D/I    Labs:     Results     Procedure Component Value Units Date/Time    Basic Metabolic Panel [[id number]]  (Abnormal) Collected: 05/31/20 0403    Specimen: Blood Updated: 05/31/20 0454     Glucose 135 mg/dL      BUN 47.8 mg/dL      Creatinine 1.6 mg/dL      Calcium 8.0 mg/dL      Sodium 295 mEq/L      Potassium 4.8 mEq/L      Chloride 101 mEq/L       CO2 22 mEq/L      Anion Gap 9.0    GFR [[id number]] Collected: 05/31/20 0403     Updated: 05/31/20 0454     EGFR 36.3    CBC and differential [[id number]]  (Abnormal) Collected: 05/31/20 0403    Specimen: Blood Updated: 05/31/20 0426     WBC 14.22 x10 3/uL      Hgb 8.9 g/dL      Hematocrit 84.1 %      Platelets 320 x10 3/uL      RBC 2.97 x10 6/uL      MCV 90.9 fL      MCH 30.0 pg      MCHC 33.0 g/dL      RDW 13 %      MPV 10.8 fL      Neutrophils 87.6 %      Lymphocytes Automated 6.7 %      Monocytes 5.1 %  Eosinophils Automated 0.0 %      Basophils Automated 0.2 %      Immature Granulocytes 0.4 %      Nucleated RBC 0.0 /100 WBC      Neutrophils Absolute 12.46 x10 3/uL      Lymphocytes Absolute Automated 0.95 x10 3/uL      Monocytes Absolute Automated 0.72 x10 3/uL      Eosinophils Absolute Automated 0.00 x10 3/uL      Basophils Absolute Automated 0.03 x10 3/uL      Immature Granulocytes Absolute 0.06 x10 3/uL      Absolute NRBC 0.00 x10 3/uL     ABO/Rh [[id number]] Collected: 05/30/20 1147    Specimen: Blood Updated: 05/30/20 1250     ABO Rh O POS    Basic Metabolic Panel [[id number]]  (Abnormal) Collected: 05/30/20 1147    Specimen: Blood Updated: 05/30/20 1227     Glucose 90 mg/dL      BUN 78.2 mg/dL      Creatinine 1.7 mg/dL      Calcium 8.7 mg/dL      Sodium 956 mEq/L      Potassium 5.0 mEq/L      Chloride 106 mEq/L      CO2 23 mEq/L      Anion Gap 10.0    Narrative:      To be done within 30 days per guideline    GFR [[id number]] Collected: 05/30/20 1147     Updated: 05/30/20 1227     EGFR 33.8    Narrative:      To be done within 30 days per guideline    CBC without differential [[id number]]  (Abnormal) Collected: 05/30/20 1147    Specimen: Blood Updated: 05/30/20 1215     WBC 11.02 x10 3/uL      Hgb 10.4 g/dL      Hematocrit 41.3 %      Platelets 354 x10 3/uL      RBC 3.41 x10 6/uL      MCV 93.3 fL      MCH 30.5 pg      MCHC 32.7 g/dL      RDW 12 %      MPV 10.8 fL      Nucleated RBC 0.0 /100 WBC      Absolute  NRBC 0.00 x10 3/uL     Narrative:      To be done within 30 days per guideline    POCT Pregnancy Test, Urine HCG [[id number]] Collected: 05/30/20 1141     Updated: 05/30/20 1141     POCT QC Pass     POCT Pregnancy HCG Test, UR Negative     Comment: Negative Value is Normal in Healthy Males or Healthy non-pregnant Females            Rads:   Radiological Procedure reviewed.    Signed by: Crist Fat, MD

## 2020-05-31 NOTE — Progress Notes (Signed)
Pt voiding without complications.  BPs remain 150s-160s/80s on lisinopril.  Will d.c home with plan for outpatient follow up with PCP within 1 week for continued BP management.  Howard Pouch, MD

## 2020-05-31 NOTE — Progress Notes (Addendum)
Patient's BP 168/95, oncall MD made aware, new order detail in the [facility].

## 2020-05-31 NOTE — Progress Notes (Addendum)
Patient's BP now 172/92, oncall MD made aware. New order to stop LR @ 145ml/hr, and to call MD back when diastolic is above 110.

## 2020-05-31 NOTE — Discharge Summary (Signed)
Department of OB/GYN    Enrique Sack  [id number]    Date of admission: 05/30/2020 11:08 AM  Date of discharge: 05/31/2020    Admitting diagnosis: 1) AUB  2) h/o right ureteral reimplantation    Discharge diagnosis: same plus HTN    Procedures and Operations:  1) TLH/BS/cystoscopy/bilateral ureteral stent placement    Hospital Course:   Pt was admitted and underwent the above procedure.  She developed HTN postoperatively and was started on lisinopril.  On POD#1 she was meeting all of her postop milestones and was stable for discharge home.  She was instructed to follow up with her PCP within 1 week for BP management.    Discharge Medications:      Discharge Medication List      Taking    b complex vitamins tablet  Dose: 1 tablet  Take 1 tablet by mouth daily     cyclobenzaprine 10 MG tablet  Dose: 5 mg  Commonly known as: FLEXERIL  Take 0.5 tablets (5 mg total) by mouth every 8 (eight) hours as needed for Muscle spasms     HYDROmorphone 2 MG tablet  Dose: 2 mg  Commonly known as: DILAUDID  Take 1 tablet (2 mg total) by mouth every 3 (three) hours as needed for Pain     lisinopril 10 MG tablet  Dose: 10 mg  Commonly known as: ZESTRIL  Start taking on: June 01, 2020  Take 1 tablet (10 mg total) by mouth daily     loratadine 10 MG tablet  Dose: 10 mg  Commonly known as: CLARITIN  For: Hayfever  Take 10 mg by mouth every evening     Multivitamin Plus Iron Adult Tabs  Dose: 1 tablet  Take 1 tablet by mouth daily     sertraline 100 MG tablet  Dose: 100 mg  Commonly known as: ZOLOFT  For: Generalized Anxiety Disorder, Major Depressive Disorder  Take 100 mg by mouth every evening      vitamin D 25 MCG (1000 UT) tablet  Dose: 1,000 Units  Commonly known as: CHOLECALCIFEROL  Take 1,000 Units by mouth daily     Vitron-C 65-125 MG Tabs  Dose: 1 tablet  Generic drug: iron-vitamin C  Take 1 tablet by mouth every evening         STOP taking these medications    Sprintec 28 0.25-35 MG-MCG per tablet  Generic drug:  norgestimate-ethinyl estradiol

## 2020-05-31 NOTE — Discharge Instr - AVS First Page (Addendum)
Discharge Instructions for Laparoscopic Hysterectomy   You had a procedure called laparoscopic hysterectomy. A surgeon removed your uterus using instruments inserted through small incisions in your abdomen. These incisions may be sore. You may also have pain in your upper back or shoulders. This is from the gas used to enlarge your abdomen to allow your healthcare provider to see inside your pelvis and do the procedure. This pain usually goes away in a day or two. It usually takes from  1 to 4 weeks to recover from laparoscopic hysterectomy. Remember, though, that recovery time varies from woman to woman. Here's what you can do to speed your recovery after surgery.   Home care   Continue the coughing and deep breathing exercises that you learned in the hospital.  Take your medicines exactly as directed by your healthcare provider.  Prevent constipation.  Eat fruits, vegetables, and whole grains.  Drink  6 to 8  glasses of water a day, unless told to do otherwise.  Use a laxative or a mild stool softener if your healthcare provider says it's OK.  Shower as usual. Wash your incisions with mild soap and water. Pat dry.  Don't use oils, powders, or lotions on your incisions.  Don't put anything in your vagina until your healthcare provider says it's safe to do so. Don't use tampons or douches. Don't have sex.  If you had both ovaries removed, report hot flashes, mood swings, and irritability to your healthcare provider. There may be medicines that can help you.    Activity  Ask your healthcare provider when you can start driving again. It's usually OK to drive as soon as you are free of pain and able to move comfortably from side to side. Don't drive while you are still taking opioid pain medicine.  Ask others to help with chores and errands while you recover.  Don’t lift anything heavier than  10 pounds for 6 weeks.  Don’t vacuum or do other strenuous activities until the healthcare provider says it's OK.  Walk as  often as you feel able.  Climb stairs slowly and pause after every few steps.    Follow-up care  Make a follow-up appointment, or as directed.   When to call your healthcare provider  Call your healthcare provider right away if you have any of the following:   Fever above  100.4° F ( 38°C)or higher, or as advised  Chills  Bright red vaginal bleeding or vaginal bleeding that soaks more than one sanitary pad per hour  A foul smelling discharge from the vagina  Trouble urinating, leak urine or have burning when you urinate  Severe pain or bloating in your abdomen  Redness, swelling, or drainage at your incision sites  Shortness of breath or chest pain  Nausea and vomiting  Develop pain, swelling and redness in one of your legs  [employer] last reviewed this educational content on 01/07/2019    © 2000-2021 [employer]. All rights reserved. This information is not intended as a substitute for professional medical care. Always follow your healthcare professional's instructions.

## 2020-06-01 LAB — LAB USE ONLY - HISTORICAL SURGICAL PATHOLOGY

## 2020-06-14 ENCOUNTER — Encounter (INDEPENDENT_AMBULATORY_CARE_PROVIDER_SITE_OTHER): Payer: Self-pay | Admitting: Physician Assistant

## 2020-06-14 ENCOUNTER — Telehealth (INDEPENDENT_AMBULATORY_CARE_PROVIDER_SITE_OTHER): Payer: BLUE CROSS/BLUE SHIELD | Admitting: Physician Assistant

## 2020-06-14 DIAGNOSIS — F411 Generalized anxiety disorder: Secondary | ICD-10-CM

## 2020-06-14 DIAGNOSIS — G8929 Other chronic pain: Secondary | ICD-10-CM

## 2020-06-14 DIAGNOSIS — D72829 Elevated white blood cell count, unspecified: Secondary | ICD-10-CM

## 2020-06-14 DIAGNOSIS — I10 Essential (primary) hypertension: Secondary | ICD-10-CM

## 2020-06-14 DIAGNOSIS — N078 Hereditary nephropathy, not elsewhere classified with other morphologic lesions: Secondary | ICD-10-CM

## 2020-06-14 DIAGNOSIS — R6884 Jaw pain: Secondary | ICD-10-CM | POA: Insufficient documentation

## 2020-06-14 MED ORDER — DULOXETINE HCL 30 MG PO CPEP
30.0000 mg | ORAL_CAPSULE | Freq: Every day | ORAL | 1 refills | Status: DC
Start: 2020-06-14 — End: 2020-10-10

## 2020-06-14 NOTE — Progress Notes (Signed)
Subjective:      Date: 06/14/2020 12:24 PM   Patient ID: Gabrielle Dean is a 39 y.o. female.    Chief Complaint:  Chief Complaint   Patient presents with    Follow-up    Hypertension    Anxiety     HPI:  05/09/2020:  Pt here for left-sided jaw pain. Has had popping in jaw and intermittent dull pain for 3-4 months; saw dentist within past two weeks, told no signs of grinding.   At the end of last week, started with moderately severe left jaw pain that is constant.  Pt with long-standing anxiety; notes she had decreased sertraline from 100 to 50 mg once daily due todecreased libido; had added CBDto help anxiety, which did work. Then had to stop CBD per gyn forupcoming surgery, stopped 3 days before jaw pain started.  Wonders if pain related to tension,stress on lower dose of medication.  Has taken Tylenol, then later in the evening Advil 400 mg, helped slightly.  Has hereditary nephritis; nephro in [place], last visit in 6-01/2020; avoids/limits nsaids.  Gyn isDr. Maryclare Labrador; gyn prescribessertraline.Pt for hysterectomy with gyn in few weeks, will need pre-op clearance and labs.    05/12/2020:  Pt here for f/u.  Feeling much better from last visit.  Still has tension in her jaw, but much more manageable than previous.  Has taken few doses of ibuprofen, and cyclobenzaprine, and heating pad.  Yesterday, took nothing until cyclobenzaprine in the evening.  Now back on sertraline 100 mg, which seems to be helping.    06/14/2020:  Pt here for f/u, virtual visit.  Continues sertraline on 100 mg once daily.  Still feeling quite anxious, gets tense; does appear connected to menstrual cycle.  Still getting jaw pain, using muscle relaxant, pain reliever as needed (tylenol)--approx a few times weekly; wonders about doing PT (referred for PT 05/09/2020, needs new referral).  Interested in trying alternate medication for anxiety.    Yesterday is two weeks post-op s/p total lap hysterectomy; feels 'so much better';  seeing OB/GYN in two days.  No pain relievers, no tylenol since end of last week.  BP was elevated pre-op and then since; pt has been in touch with nephrologist.  Now on lisinopril 10 mg once daily.  BP had been up to 150s/low 90s; BP now 120s/80.  eGFR stable 1 day post op to last week, 36.  Last wbc 14 (one day post op).      Problem List:  Patient Active Problem List   Diagnosis    Iron deficiency    Vitamin D deficiency    Mixed hyperlipidemia    Hereditary nephritis    GAD (generalized anxiety disorder)    Abnormal uterine bleeding (AUB)    Benign essential hypertension    Chronic jaw pain       Current Medications:  Outpatient Medications Marked as Taking for the 06/14/20 encounter (Telemedicine Visit) with Zunaira Lamy, Windy Fast, PA   Medication Sig Dispense Refill    iron-vitamin C (Vitron-C) 65-125 MG Tab Take 1 tablet by mouth every evening         lisinopril (ZESTRIL) 10 MG tablet Take 1 tablet (10 mg total) by mouth daily 30 tablet 0    loratadine (CLARITIN) 10 MG tablet Take 10 mg by mouth every evening      sertraline (ZOLOFT) 100 MG tablet Take 100 mg by mouth every evening            Allergies:  Allergies   Allergen Reactions    Codeine Nausea And Vomiting       Past Medical History:  Past Medical History:   Diagnosis Date    Anemia     On Vitron C    Anxiety     Depression     Gestational diabetes 2013    with second pregnancy - resolved    Nephritis 2012    heriditary - monitored/manged by Nephrology in [facility] Grip 6 month follow up       Past Surgical History:  Past Surgical History:   Procedure Laterality Date    CYSTOSCOPY N/A 05/30/2020    Procedure: CYSTOSCOPY URETERAL STENT PLACEMENT;  Surgeon: Fanny Skates, MD;  Location: [facility];  Service: Gynecology;  Laterality: N/A;    LAPAROSCOPIC,HYSTERECTOMY,TOTAL,BILATERAL SALPINECTOMY N/A 05/30/2020    Procedure: LAPAROSCOPIC HYSTERECTOMY BILATERAL SALPINGECTOMY;  Surgeon: Fanny Skates, MD;  Location: [facility];  Service:  Gynecology;  Laterality: N/A;    uterer Right 1990    repair    VAGINAL DELIVERY  11/30/2009    1 M, 8lb 12 oz, 38weeks 6days.    VAGINAL DELIVERY  07/15/2012    58F, 8lb 14oz, 38weeks 5 days.     Family History:  Family History   Problem Relation Age of Onset    Arthritis Mother     Anxiety disorder Mother     Depression Mother     Hypertension Mother     Hyperlipidemia Mother     Nephritis Mother     Kidney cancer Father     Hypertension Father     Hyperlipidemia Father     Arthritis Maternal Grandmother     Hypertension Maternal Grandmother     Nephritis Maternal Grandmother     Arthritis Maternal Grandfather     Hypertension Maternal Grandfather     Diabetes type II Paternal Grandmother     Myocardial Infarction Paternal Grandmother         X2    Crohn's disease Son        Social History:  Social History     Tobacco Use    Smoking status: Never Smoker    Smokeless tobacco: Never Used   Haematologist Use: Never used   Substance Use Topics    Alcohol use: Not on file     Comment: Rarely    Drug use: Not Currently     Comment: CBD oil, 1 drop nightly - stopped 05/02/20 per surgeon per pt       The following sections were reviewed this encounter by the provider:   Tobacco   Allergies   Meds   Problems   Med Hx   Surg Hx   Fam Hx          Vitals:  There were no vitals taken for this visit.    ROS:  Review of Systems   Musculoskeletal: Positive for arthralgias.   Psychiatric/Behavioral: Negative for dysphoric mood. The patient is nervous/anxious.       Objective:     Examination:   Physical Exam   General Examination:  GENERAL APPEARANCE: alert, in no acute distress, well developed, well nourished, oriented to time, place, and person.   SKIN: visualized skin surfaces without rashes  HEAD: normocephalic, atraumatic.  EYE: normal sclera/conjunctiva  ENMT: no swelling of nose visible.  Upper & lower lips normal color, moist, no cracks or lesions visualized.  LUNGS: normal effort / no  distress, no  use of accessory muscles in breathing  PSYCH: alert, oriented, cognitive function intact.  Mood normal.    Assessment/Plan:   1. Hereditary nephritis  - Basic Metabolic Panel; Future  - Recheck BMP    2. Benign essential hypertension  - Continue lisinopril once daily  - Encourage checking BP at home 3-4 times weekly, goal 130/80 or less    3. Leukocytosis, unspecified type  - CBC and differential; Future  - Recheck; if remains elevated, refer to heme for consult    4. Chronic jaw pain  - Referral to Physical Therapy - EXTERNAL  - DULoxetine (Cymbalta) 30 MG capsule; Take 1 capsule (30 mg total) by mouth daily In the morning x 2 weeks, then increase to 60 mg once daily  Dispense: 60 capsule; Refill: 1  - Referral for PT re-entered  - Switching duloxetine as below, will hopefully help    5. GAD (generalized anxiety disorder)  - DULoxetine (Cymbalta) 30 MG capsule; Take 1 capsule (30 mg total) by mouth daily In the morning x 2 weeks, then increase to 60 mg once daily  Dispense: 60 capsule; Refill: 1  - Start duloxetine 30 mg once daily in the morning; take sertraline 50 mg once daily (in the evening) for 2 weeks.  After 2 weeks, increase duloxetine to 60 mg once daily and stop sertraline    Return in about 4 weeks (around 07/12/2020) for follow up, virtual visit.     Jaimy Kliethermes A Ladesha Pacini, PA-C    This visit was completed virtually due to the restrictions of the COVID19 Pandemic.  All issues above were discussed and addressed but no physical exam was performed unless allowed by visual confirmation.  If it was assessed that the patient's symptoms warranted an office visit, the patient was advised and offered an appointment in the office.

## 2020-06-28 ENCOUNTER — Other Ambulatory Visit (INDEPENDENT_AMBULATORY_CARE_PROVIDER_SITE_OTHER): Payer: Self-pay | Admitting: Physician Assistant

## 2020-07-12 ENCOUNTER — Telehealth (INDEPENDENT_AMBULATORY_CARE_PROVIDER_SITE_OTHER): Payer: BLUE CROSS/BLUE SHIELD | Admitting: Physician Assistant

## 2020-08-11 ENCOUNTER — Encounter (INDEPENDENT_AMBULATORY_CARE_PROVIDER_SITE_OTHER): Payer: Self-pay | Admitting: Physician Assistant

## 2020-09-29 ENCOUNTER — Encounter (INDEPENDENT_AMBULATORY_CARE_PROVIDER_SITE_OTHER): Payer: Self-pay | Admitting: Physician Assistant

## 2020-10-03 ENCOUNTER — Encounter (INDEPENDENT_AMBULATORY_CARE_PROVIDER_SITE_OTHER): Payer: Self-pay

## 2020-10-06 LAB — BASIC METABOLIC PANEL
BUN / Creatinine Ratio: 15 (ref 9–23)
BUN: 27 mg/dL — ABNORMAL HIGH (ref 6–20)
CO2: 24 mmol/L (ref 20–29)
Calcium: 9.3 mg/dL (ref 8.7–10.2)
Chloride: 98 mmol/L (ref 96–106)
Creatinine: 1.75 mg/dL — ABNORMAL HIGH (ref 0.57–1.00)
Glucose: 81 mg/dL (ref 65–99)
Potassium: 5.2 mmol/L (ref 3.5–5.2)
Sodium: 138 mmol/L (ref 134–144)
eGFR: 38 mL/min/{1.73_m2} — ABNORMAL LOW (ref >59)

## 2020-10-06 LAB — CBC AND DIFFERENTIAL
Baso(Absolute): 0.1 10*3/uL (ref 0.0–0.2)
Basos: 1 %
Eos: 2 %
Eosinophils Absolute: 0.2 10*3/uL (ref 0.0–0.4)
Hematocrit: 37.6 % (ref 34.0–46.6)
Hemoglobin: 12.8 g/dL (ref 11.1–15.9)
Immature Granulocytes Absolute: 0 10*3/uL (ref 0.0–0.1)
Immature Granulocytes: 0 %
Lymphocytes Absolute: 1.7 10*3/uL (ref 0.7–3.1)
Lymphocytes: 18 %
MCH: 30.2 pg (ref 26.6–33.0)
MCHC: 34 g/dL (ref 31.5–35.7)
MCV: 89 fL (ref 79–97)
Monocytes Absolute: 0.6 10*3/uL (ref 0.1–0.9)
Monocytes: 7 %
Neutrophils Absolute: 6.9 10*3/uL (ref 1.4–7.0)
Neutrophils: 72 %
Platelets: 311 10*3/uL (ref 150–450)
RBC: 4.24 x10E6/uL (ref 3.77–5.28)
RDW: 12.6 % (ref 11.7–15.4)
WBC: 9.5 10*3/uL (ref 3.4–10.8)

## 2020-10-10 ENCOUNTER — Encounter (INDEPENDENT_AMBULATORY_CARE_PROVIDER_SITE_OTHER): Payer: Self-pay | Admitting: Physician Assistant

## 2020-10-10 ENCOUNTER — Telehealth (INDEPENDENT_AMBULATORY_CARE_PROVIDER_SITE_OTHER): Payer: BLUE CROSS/BLUE SHIELD | Admitting: Physician Assistant

## 2020-10-10 ENCOUNTER — Telehealth (INDEPENDENT_AMBULATORY_CARE_PROVIDER_SITE_OTHER): Payer: Self-pay | Admitting: Physician Assistant

## 2020-10-10 DIAGNOSIS — G8929 Other chronic pain: Secondary | ICD-10-CM

## 2020-10-10 DIAGNOSIS — N078 Hereditary nephropathy, not elsewhere classified with other morphologic lesions: Secondary | ICD-10-CM

## 2020-10-10 DIAGNOSIS — M791 Myalgia, unspecified site: Secondary | ICD-10-CM

## 2020-10-10 DIAGNOSIS — M2550 Pain in unspecified joint: Secondary | ICD-10-CM

## 2020-10-10 DIAGNOSIS — R6884 Jaw pain: Secondary | ICD-10-CM

## 2020-10-10 DIAGNOSIS — F411 Generalized anxiety disorder: Secondary | ICD-10-CM

## 2020-10-10 MED ORDER — CYCLOBENZAPRINE HCL 10 MG PO TABS
5.0000 mg | ORAL_TABLET | Freq: Three times a day (TID) | ORAL | 0 refills | Status: AC | PRN
Start: 2020-10-10 — End: ?

## 2020-10-10 NOTE — Telephone Encounter (Signed)
Pt had labs drawn last week--please call [facility] to add ANA, RF, CRP, CK    Please let pt know if unable to be added    Thanks!    Fleet Contras

## 2020-10-10 NOTE — Progress Notes (Signed)
Subjective:      Date: 10/10/2020 2:09 PM   Patient ID: Gabrielle Dean is a 40 y.o. female.    Chief Complaint:  Chief Complaint   Patient presents with    Jaw Pain    Anxiety    Chronic Kidney Disease     HPI:  05/09/2020:  Pt here for left-sided jaw pain. Has had popping in jaw and intermittent dull pain for 3-4 months; saw dentist within past two weeks, told no signs of grinding.   At the end of last week, started with moderately severe left jaw pain that is constant.  Pt with long-standing anxiety; notes she had decreased sertraline from 100 to 50 mg once daily due todecreased libido; had added CBDto help anxiety, which did work. Then had to stop CBD per gyn forupcoming surgery, stopped 3 days before jaw pain started.  Wonders if pain related to tension,stress on lower dose of medication.  Has taken Tylenol, then later in the evening Advil 400 mg, helped slightly.  Has hereditary nephritis; nephro in [place], last visit in 6-01/2020; avoids/limits nsaids.  Gyn isDr. Maryclare Labrador; gyn prescribessertraline.Pt for hysterectomy with gyn in few weeks, will need pre-op clearance and labs.    05/12/2020:  Pt here for f/u. Feeling much better from last visit. Still has tension in her jaw, but much more manageable than previous.  Has taken few doses of ibuprofen, and cyclobenzaprine, and heating pad.  Yesterday, took nothing until cyclobenzaprine in the evening.  Now back on sertraline 100 mg, which seems to be helping.    06/14/2020:  Pt here for f/u, virtual visit.  Continues sertraline on 100 mg once daily.  Still feeling quite anxious, gets tense; does appear connected to menstrual cycle.  Still getting jaw pain, using muscle relaxant, pain reliever as needed (tylenol)--approx a few times weekly; wonders about doing PT (referred for PT 05/09/2020, needs new referral).  Interested in trying alternate medication for anxiety.    Yesterday is two weeks post-op s/p total lap hysterectomy; feels 'so much  better'; seeing OB/GYN in two days.  No pain relievers, no tylenol since end of last week.  BP was elevated pre-op and then since; pt has been in touch with nephrologist.  Now on lisinopril 10 mg once daily.  BP had been up to 150s/low 90s; BP now 120s/80.  eGFR stable 1 day post op to last week, 36.  Last wbc 14 (one day post op).    10/10/2020:  Pt here for virtual visit, f/u.  Tried to switch to duloxetine, felt depressed.  Did for one week or less, so went back to sertraline 100 mg once daily.  A lot of stress--one thing after the next since Christmas.  Stress with son's health since January 2022, when insurance would no longer cover his medication, had to try two alternatives, which were not working.  He had a colonoscopy today.    Continues to get muscle pain, few weeks ago, was so terrible couldn't get out of bed.  Notices joint pain with weather changes.  Myalgias mostly in upper back/neck, jaw, triggering headaches.   Lots of muscle pain with exercise.  Walking dog is okay.  Pain if tries to do weight training.   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  When feels symptoms coming on, takes cyclobenzaprine 10 mg 1/2 tablet and an Advil, which she really tries not to take due to kidney disease.  eGFR 38 with  blood work last week, electrolytes normal; CBC normal.  Has not seen nephrologist recently.  Has not been taking lisinopril--BP too low in the morning when she takes it.    May be moving back to [place], will know in 12/2020.    Problem List:  Patient Active Problem List   Diagnosis    Iron deficiency    Vitamin D deficiency    Mixed hyperlipidemia    Hereditary nephritis    GAD (generalized anxiety disorder)    Abnormal uterine bleeding (AUB)    Benign essential hypertension    Chronic jaw pain       Current Medications:  Outpatient Medications Marked as Taking for the 10/10/20 encounter (Telemedicine Visit) with Yong Grieser, Windy Fast, PA   Medication Sig Dispense Refill     cyclobenzaprine (FLEXERIL) 10 MG tablet Take 0.5 tablets (5 mg total) by mouth 3 (three) times daily as needed for Muscle spasms 30 tablet 0    iron-vitamin C (Vitron-C) 65-125 MG Tab Take 1 tablet by mouth every evening         loratadine (CLARITIN) 10 MG tablet Take 10 mg by mouth every evening      sertraline (ZOLOFT) 100 MG tablet Take 100 mg by mouth every evening         [DISCONTINUED] cyclobenzaprine (FLEXERIL) 10 MG tablet TAKE 1/2 TABLET BY MOUTH EVERY 8 HOURS AS NEEDED FOR MUSCLE SPASMS 20 tablet 0       Allergies:  Allergies   Allergen Reactions    Codeine Nausea And Vomiting       Past Medical History:  Past Medical History:   Diagnosis Date    Anemia     On Vitron C    Anxiety     Depression     Gestational diabetes 2013    with second pregnancy - resolved    Nephritis 2012    heriditary - monitored/manged by Nephrology in [facility] Grip 6 month follow up       Past Surgical History:  Past Surgical History:   Procedure Laterality Date    CYSTOSCOPY N/A 05/30/2020    Procedure: CYSTOSCOPY URETERAL STENT PLACEMENT;  Surgeon: Fanny Skates, MD;  Location: [facility];  Service: Gynecology;  Laterality: N/A;    LAPAROSCOPIC,HYSTERECTOMY,TOTAL,BILATERAL SALPINECTOMY N/A 05/30/2020    Procedure: LAPAROSCOPIC HYSTERECTOMY BILATERAL SALPINGECTOMY;  Surgeon: Fanny Skates, MD;  Location: [facility];  Service: Gynecology;  Laterality: N/A;    uterer Right 1990    repair    VAGINAL DELIVERY  11/30/2009    1 M, 8lb 12 oz, 38weeks 6days.    VAGINAL DELIVERY  07/15/2012    67F, 8lb 14oz, 38weeks 5 days.       Family History:  Family History   Problem Relation Age of Onset    Arthritis Mother     Anxiety disorder Mother     Depression Mother     Hypertension Mother     Hyperlipidemia Mother     Nephritis Mother     Kidney cancer Father     Hypertension Father     Hyperlipidemia Father     Nephritis Sister     Arthritis Maternal Grandmother     Hypertension Maternal Grandmother      Nephritis Maternal Grandmother     Arthritis Maternal Grandfather     Hypertension Maternal Grandfather     Diabetes type II Paternal Grandmother     Myocardial Infarction Paternal Grandmother  X2    Crohn's disease Son        Social History:  Social History     Tobacco Use    Smoking status: Never Smoker    Smokeless tobacco: Never Used   Haematologist Use: Never used   Substance Use Topics    Drug use: Not Currently     Comment: CBD oil, 1 drop nightly - stopped 05/02/20 per surgeon per pt      The following sections were reviewed this encounter by the provider:   Tobacco   Allergies   Meds   Problems   Med Hx   Surg Hx   Fam Hx            Vitals:  There were no vitals taken for this visit.    ROS:  Review of Systems   Musculoskeletal: Positive for arthralgias, myalgias and neck pain.   Psychiatric/Behavioral: Negative for dysphoric mood. The patient is nervous/anxious.       Objective:     Examination:   Physical Exam   General Examination:  GENERAL APPEARANCE: alert, in no acute distress, well developed, well nourished, oriented to time, place, and person.   SKIN: visualized skin surfaces without rashes  HEAD: normocephalic, atraumatic.  EYE: normal sclera/conjunctiva  ENMT: no swelling of nose visible.  Upper & lower lips normal color, moist, no cracks or lesions visualized.  LUNGS: normal effort / no distress, no use of accessory muscles in breathing  PSYCH: alert, oriented, cognitive function intact.  Mood normal.    Assessment/Plan:   1. Myalgia  - cyclobenzaprine (FLEXERIL) 10 MG tablet; Take 0.5 tablets (5 mg total) by mouth 3 (three) times daily as needed for Muscle spasms  Dispense: 30 tablet; Refill: 0  - Ambulatory referral to Physical Therapy  - Rheumatology Referral: Mickle Asper, MD ([facility])  - Anti-Nuclear Antibodies , with Reflex; Future  - Rheumatoid factor; Future  - Creatine Kinase (CK); Future  - High sensitivity CRP; Future  -  Cyclobenzaprine refilled for use sparingly as needed  - Refer for PT, to rheum  - Attempting to add labs on from recent blood draw    2. Polyarthralgia  - Rheumatology Referral: Mickle Asper, MD ([facility])  - Anti-Nuclear Antibodies , with Reflex; Future  - Rheumatoid factor; Future  - Creatine Kinase (CK); Future  - High sensitivity CRP; Future    3. Chronic jaw pain  - cyclobenzaprine (FLEXERIL) 10 MG tablet; Take 0.5 tablets (5 mg total) by mouth 3 (three) times daily as needed for Muscle spasms  Dispense: 30 tablet; Refill: 0  - Ambulatory referral to Physical Therapy    4. Hereditary nephritis  - Pt to provide recent labs to nephrologist in [place]; establish with nephro in [place] if will be staying here  - ? Consider low dose ARB    5. GAD (generalized anxiety disorder)  - Continue sertraline at present; we could consider trying duloxetine again in the future    Return in about 3 months (around 01/09/2021) for follow up, can be virtual if desires.     Kaydince Towles A Alfonzia Woolum, PA-C    This visit was completed virtually due to the restrictions of the COVID19 Pandemic.  All issues above were discussed and addressed but no physical exam was performed unless allowed by visual confirmation.  If it was assessed that the patient's symptoms warranted an office visit, the patient was advised and  offered an appointment in the office.

## 2020-10-10 NOTE — Progress Notes (Signed)
Labs as discussed during visit--pt will provide to nephrologist; CBC normalized

## 2020-10-10 NOTE — Telephone Encounter (Signed)
Add on test request faxed, to [facility], per request.  The Lab will notify you whether or not testing may be added.

## 2020-10-11 ENCOUNTER — Encounter (INDEPENDENT_AMBULATORY_CARE_PROVIDER_SITE_OTHER): Payer: Self-pay | Admitting: Physician Assistant

## 2020-10-11 DIAGNOSIS — M791 Myalgia, unspecified site: Secondary | ICD-10-CM

## 2020-10-11 DIAGNOSIS — M2550 Pain in unspecified joint: Secondary | ICD-10-CM

## 2020-10-13 ENCOUNTER — Ambulatory Visit (INDEPENDENT_AMBULATORY_CARE_PROVIDER_SITE_OTHER): Payer: BLUE CROSS/BLUE SHIELD | Admitting: Physician Assistant

## 2020-10-23 ENCOUNTER — Encounter (INDEPENDENT_AMBULATORY_CARE_PROVIDER_SITE_OTHER): Payer: Self-pay | Admitting: Physician Assistant

## 2020-11-02 LAB — HIGH SENSITIVITY CRP: C-Reactive Protein, High Sensitive: 2.05 mg/L (ref 0.00–3.00)

## 2020-11-02 LAB — CK: Creatinine Kinase, Total: 32 U/L (ref 32–182)

## 2020-11-02 LAB — ANTI-NUCLEAR ANTIBODIES , WITH REFLEX: Antinuclear Antibodies (ANA): NEGATIVE

## 2020-11-02 LAB — SPECIMEN STATUS REPORT

## 2020-11-02 LAB — RHEUMATOID FACTOR: RA Latex Turbid.: 13.4 IU/mL (ref <14.0)

## 2020-12-01 ENCOUNTER — Encounter (INDEPENDENT_AMBULATORY_CARE_PROVIDER_SITE_OTHER): Payer: Self-pay

## 2021-02-14 ENCOUNTER — Telehealth (INDEPENDENT_AMBULATORY_CARE_PROVIDER_SITE_OTHER): Payer: BLUE CROSS/BLUE SHIELD | Admitting: Physician Assistant

## 2021-03-14 ENCOUNTER — Encounter (INDEPENDENT_AMBULATORY_CARE_PROVIDER_SITE_OTHER): Payer: Self-pay

## 2021-04-24 ENCOUNTER — Encounter (INDEPENDENT_AMBULATORY_CARE_PROVIDER_SITE_OTHER): Payer: Self-pay

## 2021-12-20 IMAGING — US US RENAL
1 series · 14 of 25 positions shown · non-contrast
Comparison: None.

CLINICAL DATA: Hypertension, unspecified type. Proteinuria,
unspecified type

EXAM:
RENAL / URINARY TRACT ULTRASOUND COMPLETE

[Series 1: us renal · 0.18mm/px · 14 of 36 slices shown]
[im 1/36]
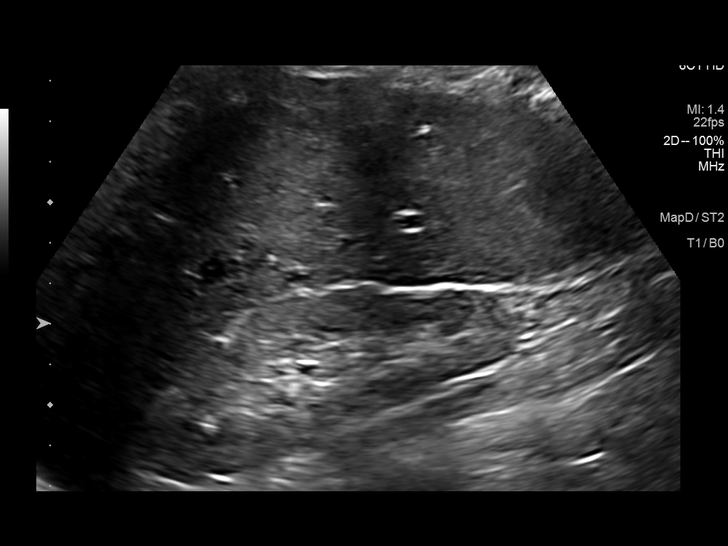
[im 3/36]
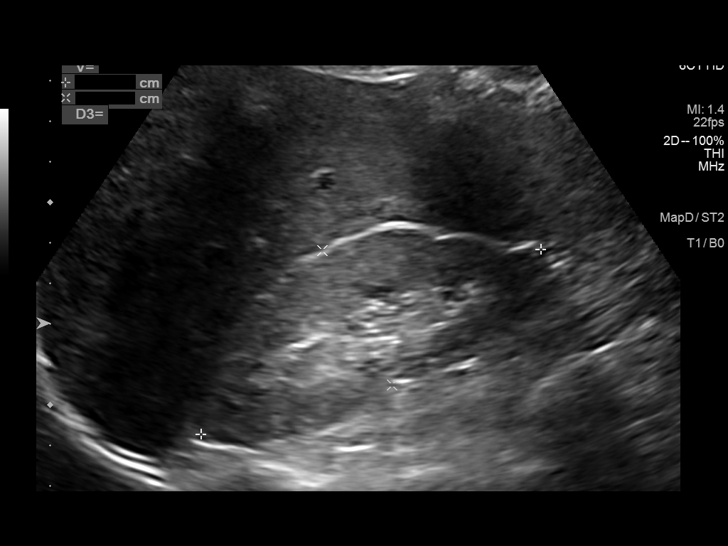
[im 6/36]
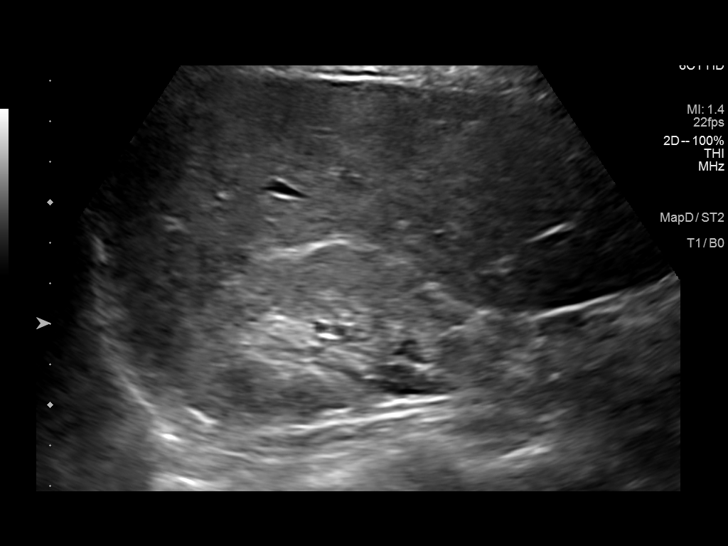
[im 9/36]
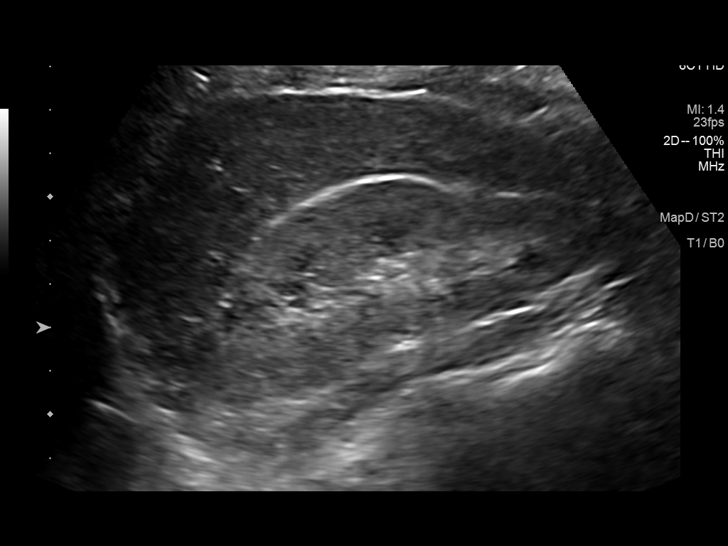
[im 12/36]
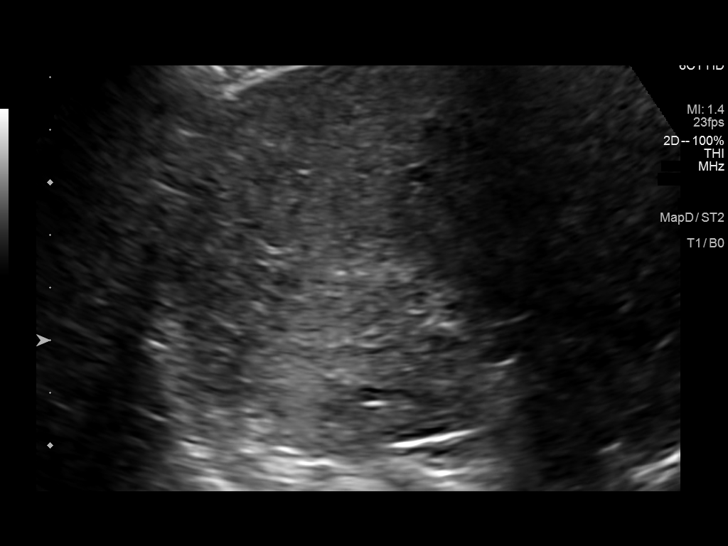
[im 14/36]
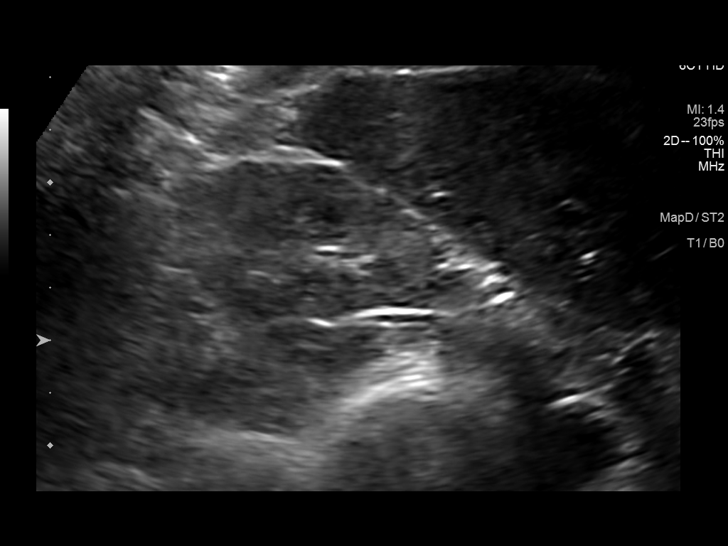
[im 17/36]
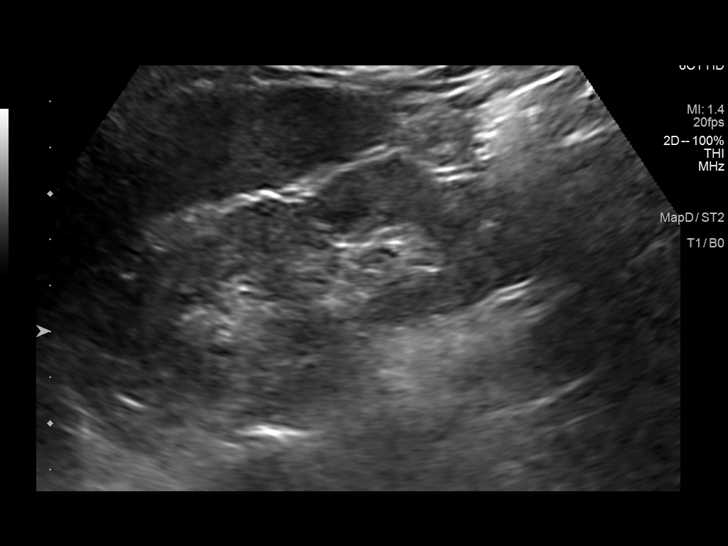
[im 19/36]
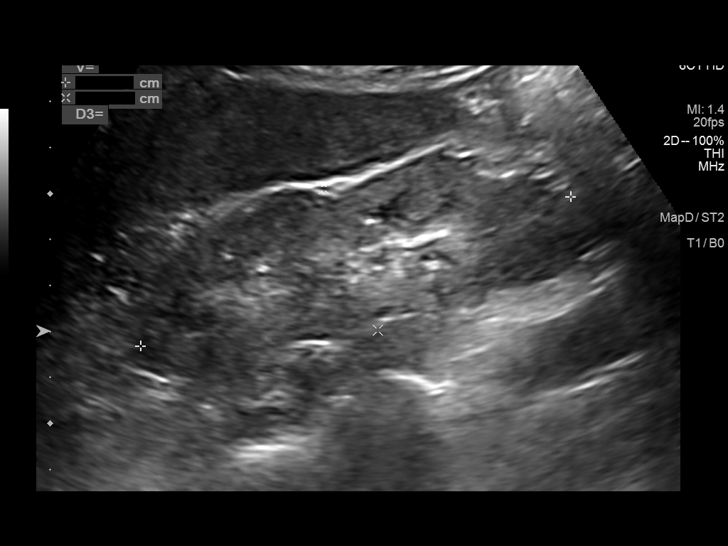
[im 22/36]
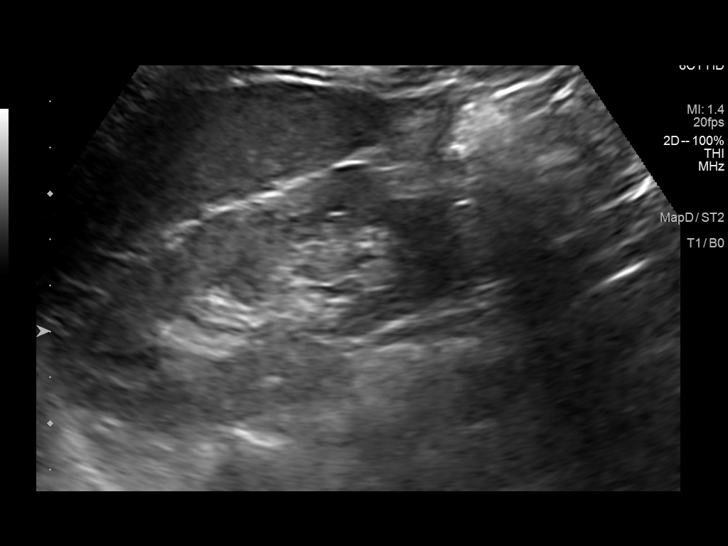
[im 24/36]
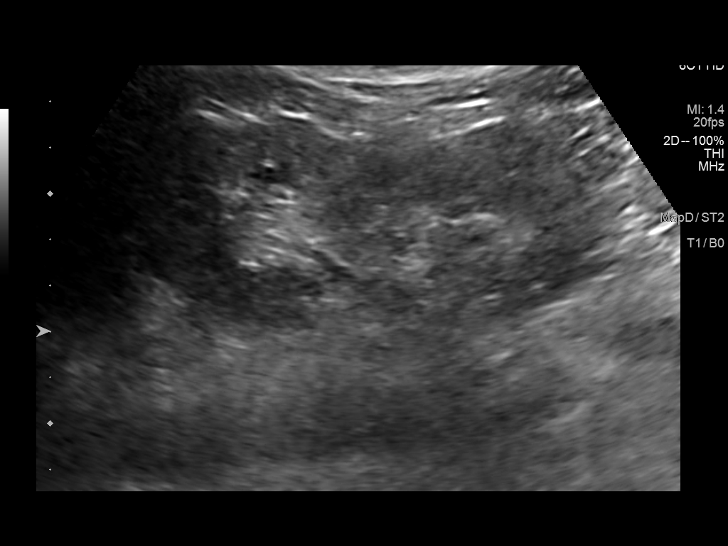
[im 27/36]
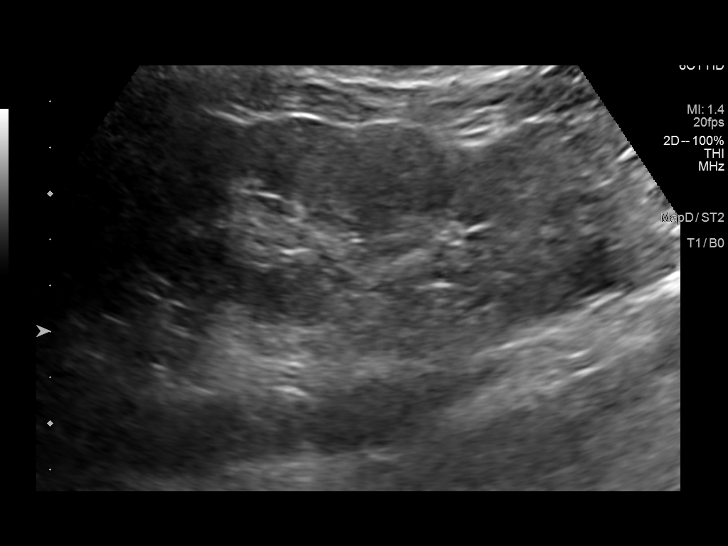
[im 30/36]
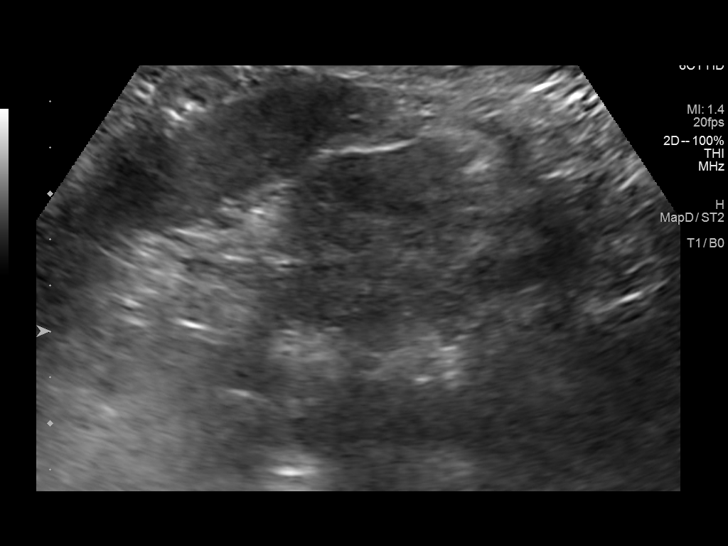
[im 33/36]
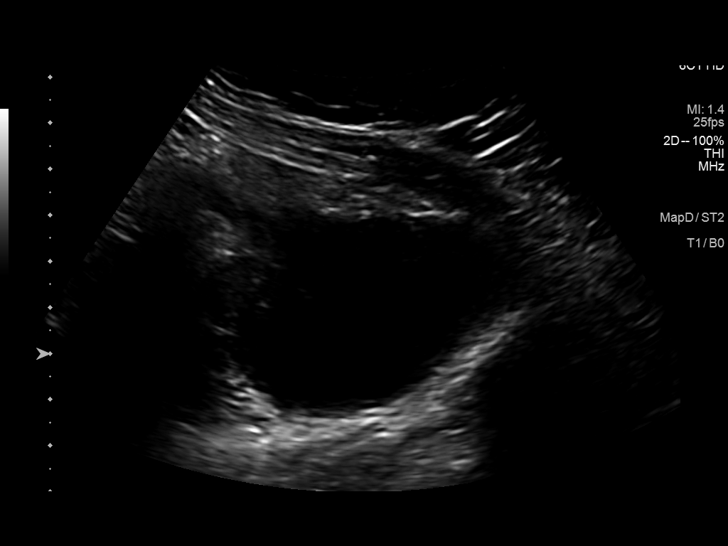
[im 36/36]
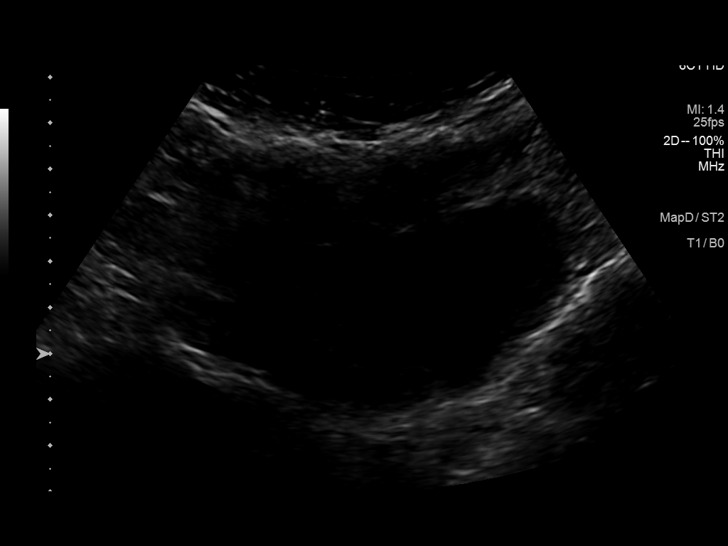

[14 of 25 positions shown; findings below may reference images not displayed]

FINDINGS: Right Kidney:

Renal measurements: 9.5 x 3.7 x 5.6 cm = volume: 103 mL. Increased
renal cortical echogenicity. No hydronephrosis.

Left Kidney:

Renal measurements: 9.9 x 3.3 x 4.9 cm = volume: 83 mL. Increased
renal cortical echogenicity. No hydronephrosis.

Bladder:

Appears normal for degree of bladder distention.

Other:

None.
IMPRESSION: Increased renal cortical echogenicity bilaterally, which can be seen
in medical renal disease. No hydronephrosis.
